# Patient Record
Sex: Female | Born: 1990
Health system: Southern US, Community
[De-identification: ages and names within clinical notes are randomized; demographics above are authoritative.]

## PROBLEM LIST (undated history)

## (undated) DIAGNOSIS — K589 Irritable bowel syndrome without diarrhea: Secondary | ICD-10-CM

## (undated) DIAGNOSIS — L732 Hidradenitis suppurativa: Secondary | ICD-10-CM

## (undated) DIAGNOSIS — Z8619 Personal history of other infectious and parasitic diseases: Secondary | ICD-10-CM

## (undated) HISTORY — DX: Irritable bowel syndrome without diarrhea: K58.9

## (undated) HISTORY — DX: Hidradenitis suppurativa: L73.2

## (undated) HISTORY — DX: Personal history of other infectious and parasitic diseases: Z86.19

---
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
## 2014-10-10 DIAGNOSIS — E569 Vitamin deficiency, unspecified: Secondary | ICD-10-CM | POA: Insufficient documentation

## 2014-10-10 DIAGNOSIS — E876 Hypokalemia: Secondary | ICD-10-CM | POA: Insufficient documentation

## 2016-11-30 DIAGNOSIS — K589 Irritable bowel syndrome without diarrhea: Secondary | ICD-10-CM

## 2016-11-30 HISTORY — DX: Irritable bowel syndrome, unspecified: K58.9

## 2018-12-14 ENCOUNTER — Ambulatory Visit: Payer: Self-pay | Admitting: Physician Assistant

## 2018-12-16 ENCOUNTER — Ambulatory Visit (INDEPENDENT_AMBULATORY_CARE_PROVIDER_SITE_OTHER): Payer: No Typology Code available for payment source | Admitting: Physician Assistant

## 2018-12-16 ENCOUNTER — Encounter: Payer: Self-pay | Admitting: Physician Assistant

## 2018-12-16 VITALS — BP 110/60 | HR 93 | Temp 98.8°F | Ht 68.0 in | Wt 211.2 lb

## 2018-12-16 DIAGNOSIS — Z13220 Encounter for screening for lipoid disorders: Secondary | ICD-10-CM | POA: Diagnosis not present

## 2018-12-16 DIAGNOSIS — Z0001 Encounter for general adult medical examination with abnormal findings: Secondary | ICD-10-CM | POA: Diagnosis not present

## 2018-12-16 DIAGNOSIS — Z136 Encounter for screening for cardiovascular disorders: Secondary | ICD-10-CM

## 2018-12-16 DIAGNOSIS — Z114 Encounter for screening for human immunodeficiency virus [HIV]: Secondary | ICD-10-CM

## 2018-12-16 DIAGNOSIS — E669 Obesity, unspecified: Secondary | ICD-10-CM

## 2018-12-16 DIAGNOSIS — R635 Abnormal weight gain: Secondary | ICD-10-CM

## 2018-12-16 NOTE — Patient Instructions (Signed)
It was great to see you!  Please go to the lab for blood work.   Our office will call you with your results unless you have chosen to receive results via MyChart.  If your blood work is normal we will follow-up each year for physicals and as scheduled for chronic medical problems.  If anything is abnormal we will treat accordingly and get you in for a follow-up.  Take care,  [facility] Maintenance, Female Adopting a healthy lifestyle and getting preventive care can go a long way to promote health and wellness. Talk with your health care provider about what schedule of regular examinations is right for you. This is a good chance for you to check in with your provider about disease prevention and staying healthy. In between checkups, there are plenty of things you can do on your own. Experts have done a lot of research about which lifestyle changes and preventive measures are most likely to keep you healthy. Ask your health care provider for more information. Weight and diet Eat a healthy diet  Be sure to include plenty of vegetables, fruits, low-fat dairy products, and lean protein.  Do not eat a lot of foods high in solid fats, added sugars, or salt.  Get regular exercise. This is one of the most important things you can do for your health. ? Most adults should exercise for at least 150 minutes each week. The exercise should increase your heart rate and make you sweat (moderate-intensity exercise). ? Most adults should also do strengthening exercises at least twice a week. This is in addition to the moderate-intensity exercise. Maintain a healthy weight  Body mass index (BMI) is a measurement that can be used to identify possible weight problems. It estimates body fat based on height and weight. Your health care provider can help determine your BMI and help you achieve or maintain a healthy weight.  For females 57 years of age and older: ? A BMI below 18.5 is considered  underweight. ? A BMI of 18.5 to 24.9 is normal. ? A BMI of 25 to 29.9 is considered overweight. ? A BMI of 30 and above is considered obese. Watch levels of cholesterol and blood lipids  You should start having your blood tested for lipids and cholesterol at 30 years of age, then have this test every 5 years.  You may need to have your cholesterol levels checked more often if: ? Your lipid or cholesterol levels are high. ? You are older than 30 years of age. ? You are at high risk for heart disease. Cancer screening Lung Cancer  Lung cancer screening is recommended for adults 64-69 years old who are at high risk for lung cancer because of a history of smoking.  A yearly low-dose CT scan of the lungs is recommended for people who: ? Currently smoke. ? Have quit within the past 15 years. ? Have at least a 30-pack-year history of smoking. A pack year is smoking an average of one pack of cigarettes a day for 1 year.  Yearly screening should continue until it has been 15 years since you quit.  Yearly screening should stop if you develop a health problem that would prevent you from having lung cancer treatment. Breast Cancer  Practice breast self-awareness. This means understanding how your breasts normally appear and feel.  It also means doing regular breast self-exams. Let your health care provider know about any changes, no matter how small.  If you are in your  your 20s or 30s, you should have a clinical breast exam (CBE) by a health care provider every 1-3 years as part of a regular health exam.  If you are 40 or older, have a CBE every year. Also consider having a breast X-ray (mammogram) every year.  If you have a family history of breast cancer, talk to your health care provider about genetic screening.  If you are at high risk for breast cancer, talk to your health care provider about having an MRI and a mammogram every year.  Breast cancer gene (BRCA) assessment is recommended  for women who have family members with BRCA-related cancers. BRCA-related cancers include: ? Breast. ? Ovarian. ? Tubal. ? Peritoneal cancers.  Results of the assessment will determine the need for genetic counseling and BRCA1 and BRCA2 testing. Cervical Cancer Your health care provider may recommend that you be screened regularly for cancer of the pelvic organs (ovaries, uterus, and vagina). This screening involves a pelvic examination, including checking for microscopic changes to the surface of your cervix (Pap test). You may be encouraged to have this screening done every 3 years, beginning at age 21.  For women ages 30-65, health care providers may recommend pelvic exams and Pap testing every 3 years, or they may recommend the Pap and pelvic exam, combined with testing for human papilloma virus (HPV), every 5 years. Some types of HPV increase your risk of cervical cancer. Testing for HPV may also be done on women of any age with unclear Pap test results.  Other health care providers may not recommend any screening for nonpregnant women who are considered low risk for pelvic cancer and who do not have symptoms. Ask your health care provider if a screening pelvic exam is right for you.  If you have had past treatment for cervical cancer or a condition that could lead to cancer, you need Pap tests and screening for cancer for at least 20 years after your treatment. If Pap tests have been discontinued, your risk factors (such as having a new sexual partner) need to be reassessed to determine if screening should resume. Some women have medical problems that increase the chance of getting cervical cancer. In these cases, your health care provider may recommend more frequent screening and Pap tests. Colorectal Cancer  This type of cancer can be detected and often prevented.  Routine colorectal cancer screening usually begins at 30 years of age and continues through 30 years of age.  Your health  care provider may recommend screening at an earlier age if you have risk factors for colon cancer.  Your health care provider may also recommend using home test kits to check for hidden blood in the stool.  A small camera at the end of a tube can be used to examine your colon directly (sigmoidoscopy or colonoscopy). This is done to check for the earliest forms of colorectal cancer.  Routine screening usually begins at age 50.  Direct examination of the colon should be repeated every 5-10 years through 30 years of age. However, you may need to be screened more often if early forms of precancerous polyps or small growths are found. Skin Cancer  Check your skin from head to toe regularly.  Tell your health care provider about any new moles or changes in moles, especially if there is a change in a mole's shape or color.  Also tell your health care provider if you have a mole that is larger than the size of a pencil   Always use sunscreen. Apply sunscreen liberally and repeatedly throughout the day.  Protect yourself by wearing long sleeves, pants, a wide-brimmed hat, and sunglasses whenever you are outside. Heart disease, diabetes, and high blood pressure  High blood pressure causes heart disease and increases the risk of stroke. High blood pressure is more likely to develop in: ? People who have blood pressure in the high end of the normal range (130-139/85-89 mm Hg). ? People who are overweight or obese. ? People who are African American.  If you are 43-31 years of age, have your blood pressure checked every 3-5 years. If you are 1 years of age or older, have your blood pressure checked every year. You should have your blood pressure measured twice-once when you are at a hospital or clinic, and once when you are not at a hospital or clinic. Record the average of the two measurements. To check your blood pressure when you are not at a hospital or clinic, you can use: ? An automated  blood pressure machine at a pharmacy. ? A home blood pressure monitor.  If you are between 15 years and 84 years old, ask your health care provider if you should take aspirin to prevent strokes.  Have regular diabetes screenings. This involves taking a blood sample to check your fasting blood sugar level. ? If you are at a normal weight and have a low risk for diabetes, have this test once every three years after 30 years of age. ? If you are overweight and have a high risk for diabetes, consider being tested at a younger age or more often. Preventing infection Hepatitis B  If you have a higher risk for hepatitis B, you should be screened for this virus. You are considered at high risk for hepatitis B if: ? You were born in a country where hepatitis B is common. Ask your health care provider which countries are considered high risk. ? Your parents were born in a high-risk country, and you have not been immunized against hepatitis B (hepatitis B vaccine). ? You have HIV or AIDS. ? You use needles to inject street drugs. ? You live with someone who has hepatitis B. ? You have had sex with someone who has hepatitis B. ? You get hemodialysis treatment. ? You take certain medicines for conditions, including cancer, organ transplantation, and autoimmune conditions. Hepatitis C  Blood testing is recommended for: ? Everyone born from [birth year] through 1967. ? Anyone with known risk factors for hepatitis C. Sexually transmitted infections (STIs)  You should be screened for sexually transmitted infections (STIs) including gonorrhea and chlamydia if: ? You are sexually active and are younger than 30 years of age. ? You are older than 30 years of age and your health care provider tells you that you are at risk for this type of infection. ? Your sexual activity has changed since you were last screened and you are at an increased risk for chlamydia or gonorrhea. Ask your health care provider if you are at  risk.  If you do not have HIV, but are at risk, it may be recommended that you take a prescription medicine daily to prevent HIV infection. This is called pre-exposure prophylaxis (PrEP). You are considered at risk if: ? You are sexually active and do not regularly use condoms or know the HIV status of your partner(s). ? You take drugs by injection. ? You are sexually active with a partner who has HIV. Talk with your health care provider  about whether you are at high risk of being infected with HIV. If you choose to begin PrEP, you should first be tested for HIV. You should then be tested every 3 months for as long as you are taking PrEP. Pregnancy  If you are premenopausal and you may become pregnant, ask your health care provider about preconception counseling.  If you may become pregnant, take 400 to 800 micrograms (mcg) of folic acid every day.  If you want to prevent pregnancy, talk to your health care provider about birth control (contraception). Osteoporosis and menopause  Osteoporosis is a disease in which the bones lose minerals and strength with aging. This can result in serious bone fractures. Your risk for osteoporosis can be identified using a bone density scan.  If you are 68 years of age or older, or if you are at risk for osteoporosis and fractures, ask your health care provider if you should be screened.  Ask your health care provider whether you should take a calcium or vitamin D supplement to lower your risk for osteoporosis.  Menopause may have certain physical symptoms and risks.  Hormone replacement therapy may reduce some of these symptoms and risks. Talk to your health care provider about whether hormone replacement therapy is right for you. Follow these instructions at home:  Schedule regular health, dental, and eye exams.  Stay current with your immunizations.  Do not use any tobacco products including cigarettes, chewing tobacco, or electronic  cigarettes.  If you are pregnant, do not drink alcohol.  If you are breastfeeding, limit how much and how often you drink alcohol.  Limit alcohol intake to no more than 1 drink per day for nonpregnant women. One drink equals 12 ounces of beer, 5 ounces of wine, or 1 ounces of hard liquor.  Do not use street drugs.  Do not share needles.  Ask your health care provider for help if you need support or information about quitting drugs.  Tell your health care provider if you often feel depressed.  Tell your health care provider if you have ever been abused or do not feel safe at home. This information is not intended to replace advice given to you by your health care provider. Make sure you discuss any questions you have with your health care provider. Document Released: 06/01/2011 Document Revised: 04/23/2016 Document Reviewed: 08/20/2015 Elsevier Interactive Patient Education  2019 [employer].

## 2018-12-16 NOTE — Progress Notes (Signed)
Gina Moss is a 28 y.o. female here to Establish Care and Annual Physical.  I acted as a Neurosurgeon for [employer], PA-C Corky Mull, LPN  History of Present Illness:   Chief Complaint  Patient presents with  . Establish Care  . Annual Exam    Acute Concerns: None  Chronic Issues: IBS -- endoscopy and colonoscopy in 2018; made dietary changes and has relieved symptoms; typically has constipation >> diarrhea Hydradenitis -- June 2019 was last flare, usually antibiotics help her symptoms, however has required two to be drained Obesity -- currently trying to get off her "grad school weight". Trying to work on better dietary choices and get into an exercise regimen.  Health Maintenance: Immunizations -- UTD Colonoscopy -- N/A Mammogram -- N/A PAP -- 2018 normal per pt will obtain records Bone Density --N/A Diet -- vegan, avoids seltzer water, avoids certain veggies that cause issues with IBS Caffeine intake -- 1 cup coffee daily Sleep habits -- good sleeper, 15 mg melatonin nightly Exercise -- does walk her dog, has exercise bike in her home Weight -- Weight: 211 lb 4 oz (95.8 kg) , highest weight 260 lb Mood -- no issues Alcohol -- none excessive Tobacco -- none  Depression screen PHQ 2/9 12/16/2018  Decreased Interest 0  Down, Depressed, Hopeless 0  PHQ - 2 Score 0    No flowsheet data found.   Other providers/specialists: Patient Care Team: Jarold Motto, [place] as PCP - General (Physician Assistant)   Past Medical History:  Diagnosis Date  . History of chicken pox   . Hydradenitis     cysts drained in 2018 and 2019  . IBS (irritable bowel syndrome) 2018     Social History   Socioeconomic History  . Marital status: Single    Spouse name: Not on file  . Number of children: Not on file  . Years of education: Not on file  . Highest education level: Not on file  Occupational History  . Not on file  Social Needs  . Financial resource strain: Not on  file  . Food insecurity:    Worry: Not on file    Inability: Not on file  . Transportation needs:    Medical: Not on file    Non-medical: Not on file  Tobacco Use  . Smoking status: Never Smoker  . Smokeless tobacco: Never Used  Substance and Sexual Activity  . Alcohol use: Yes    Frequency: Never    Comment: socially  . Drug use: Never  . Sexual activity: Yes    Comment: Vasectomy  Lifestyle  . Physical activity:    Days per week: Not on file    Minutes per session: Not on file  . Stress: Not on file  Relationships  . Social connections:    Talks on phone: Not on file    Gets together: Not on file    Attends religious service: Not on file    Active member of club or organization: Not on file    Attends meetings of clubs or organizations: Not on file    Relationship status: Not on file  . Intimate partner violence:    Fear of current or ex partner: Not on file    Emotionally abused: Not on file    Physically abused: Not on file    Forced sexual activity: Not on file  Other Topics Concern  . 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 TONSILLECTOMY AND ADENOIDECTOMY  2007  . WISDOM TOOTH EXTRACTION Bilateral 2007    Family History  Problem Relation Age of Onset  . Depression Paternal Grandmother   . Alcohol abuse Paternal Grandfather     Allergies  Allergen Reactions  . Cephalexin Itching  . Hydrocodone Anaphylaxis and Shortness Of Breath  . Sulfa Antibiotics Hives  . Naproxen Other (See Comments)    Nose bleeds  . Aspirin Other (See Comments)    Nose bleeds  . Pollen Extract      Current Medications:   Current Outpatient Medications:  Marland Kitchen.  Melatonin 5 MG CAPS, Take 15 mg by mouth at bedtime. , Disp: , Rfl:  .  polyethylene glycol powder (GLYCOLAX/MIRALAX) powder, Take by mouth., Disp: , Rfl:    Review of Systems:   Review of Systems   Constitutional: Negative for chills, fever, malaise/fatigue and weight loss.  HENT: Negative for hearing loss, sinus pain and sore throat.   Respiratory: Negative for cough and hemoptysis.   Cardiovascular: Negative for chest pain, palpitations, leg swelling and PND.  Gastrointestinal: Positive for constipation. Negative for abdominal pain, diarrhea, heartburn, nausea and vomiting.  Genitourinary: Negative for dysuria, frequency and urgency.  Musculoskeletal: Negative for back pain, myalgias and neck pain.  Skin: Negative for itching and rash.  Neurological: Negative for dizziness, tingling, seizures and headaches.  Endo/Heme/Allergies: Negative for polydipsia.  Psychiatric/Behavioral: Negative for depression. The patient is not nervous/anxious.    Vitals:   Vitals:   12/16/18 1503  BP: 110/60  Pulse: 93  Temp: 98.8 F (37.1 C)  TempSrc: Oral  SpO2: 99%  Weight: 211 lb 4 oz (95.8 kg)  Height: 5\' 8"  (1.727 m)      Body mass index is 32.12 kg/m.  Physical Exam:   Physical Exam Vitals signs and nursing note reviewed.  Constitutional:      General: She is not in acute distress.    Appearance: Normal appearance. She is well-developed. She is not ill-appearing or toxic-appearing.  HENT:     Head: Normocephalic and atraumatic.     Right Ear: Tympanic membrane, ear canal and external ear normal. Tympanic membrane is not erythematous, retracted or bulging.     Left Ear: Tympanic membrane, ear canal and external ear normal. Tympanic membrane is not erythematous, retracted or bulging.  Eyes:     General: Lids are normal.     Conjunctiva/sclera: Conjunctivae normal.     Pupils: Pupils are equal, round, and reactive to light.  Neck:     Musculoskeletal: Full passive range of motion without pain.     Trachea: Trachea normal.  Cardiovascular:     Rate and Rhythm: Normal rate and regular rhythm.     Heart sounds: Normal heart sounds, S1 normal and S2 normal.  Pulmonary:      Effort: Pulmonary effort is normal. No tachypnea or respiratory distress.     Breath sounds: Normal breath sounds. No decreased breath sounds, wheezing, rhonchi or rales.  Abdominal:     General: Bowel sounds are normal.     Palpations: Abdomen is soft.     Tenderness: There is no abdominal tenderness.  Musculoskeletal: Normal range of motion.  Lymphadenopathy:     Cervical: No cervical adenopathy.  Skin:    General: Skin is warm and dry.  Neurological:     Mental Status: She is alert.     GCS: GCS eye  subscore is 4. GCS verbal subscore is 5. GCS motor subscore is 6.     Cranial Nerves: No cranial nerve deficit.     Sensory: No sensory deficit.     Deep Tendon Reflexes: Reflexes are normal and symmetric.  Psychiatric:        Speech: Speech normal.        Behavior: Behavior normal. Behavior is cooperative.     No results found for this or any previous visit.  Assessment and Plan:   Gina Moss was seen today for establish care and annual exam.  Diagnoses and all orders for this visit:  Encounter for general adult medical examination with abnormal findings Today patient counseled on age appropriate routine health concerns for screening and prevention, each reviewed and up to date or declined. Immunizations reviewed and up to date or declined. Labs ordered and reviewed. Risk factors for depression reviewed and negative. Hearing function and visual acuity are intact. ADLs screened and addressed as needed. Functional ability and level of safety reviewed and appropriate. Education, counseling and referrals performed based on assessed risks today. Patient provided with a copy of personalized plan for preventive services.  Screening for HIV (human immunodeficiency virus) -     HIV Antibody (routine testing w rflx)  Encounter for lipid screening for cardiovascular disease -     Lipid panel  Obesity, unspecified classification, unspecified obesity type, unspecified whether serious  comorbidity present She is open to trying Metformin potentially to help with weight loss. We discussed this today in the office. Will check HgbA1c and assess if this is appropriate option for her. -     CBC with Differential/Platelet -     Comprehensive metabolic panel -     Hemoglobin A1c  . Reviewed expectations re: course of current medical issues. . Discussed self-management of symptoms. . Outlined signs and symptoms indicating need for more acute intervention. . Patient verbalized understanding and all questions were answered. . See orders for this visit as documented in the electronic medical record. . Patient received an After-Visit Summary.  CMA or LPN served as scribe during this visit. History, Physical, and Plan performed by medical provider. The above documentation has been reviewed and is accurate and complete.   Jarold MottoSamantha Nastassia Bazaldua, PA-C

## 2018-12-17 LAB — LIPID PANEL
Cholesterol: 172 mg/dL (ref <200)
HDL: 51 mg/dL (ref >50)
LDL Cholesterol (Calc): 98 mg/dL (calc)
Non-HDL Cholesterol (Calc): 121 mg/dL (calc) (ref <130)
Total CHOL/HDL Ratio: 3.4 (calc) (ref <5.0)
Triglycerides: 124 mg/dL (ref <150)

## 2018-12-17 LAB — CBC WITH DIFFERENTIAL/PLATELET
Absolute Monocytes: 592 cells/uL (ref 200–950)
BASOS ABS: 70 {cells}/uL (ref 0–200)
Basophils Relative: 0.8 %
Eosinophils Absolute: 261 cells/uL (ref 15–500)
Eosinophils Relative: 3 %
HCT: 40 % (ref 35.0–45.0)
Hemoglobin: 14 g/dL (ref 11.7–15.5)
Lymphs Abs: 2610 cells/uL (ref 850–3900)
MCH: 32.1 pg (ref 27.0–33.0)
MCHC: 35 g/dL (ref 32.0–36.0)
MCV: 91.7 fL (ref 80.0–100.0)
MPV: 10.3 fL (ref 7.5–12.5)
Monocytes Relative: 6.8 %
Neutro Abs: 5168 cells/uL (ref 1500–7800)
Neutrophils Relative %: 59.4 %
Platelets: 374 10*3/uL (ref 140–400)
RBC: 4.36 10*6/uL (ref 3.80–5.10)
RDW: 12.2 % (ref 11.0–15.0)
TOTAL LYMPHOCYTE: 30 %
WBC: 8.7 10*3/uL (ref 3.8–10.8)

## 2018-12-17 LAB — COMPREHENSIVE METABOLIC PANEL
AG Ratio: 1.8 (calc) (ref 1.0–2.5)
ALT: 9 U/L (ref 6–29)
AST: 15 U/L (ref 10–30)
Albumin: 4.6 g/dL (ref 3.6–5.1)
Alkaline phosphatase (APISO): 86 U/L (ref 33–115)
BILIRUBIN TOTAL: 0.9 mg/dL (ref 0.2–1.2)
BUN: 12 mg/dL (ref 7–25)
CALCIUM: 9.9 mg/dL (ref 8.6–10.2)
CO2: 28 mmol/L (ref 20–32)
Chloride: 103 mmol/L (ref 98–110)
Creat: 0.81 mg/dL (ref 0.50–1.10)
Globulin: 2.5 g/dL (calc) (ref 1.9–3.7)
Glucose, Bld: 78 mg/dL (ref 65–99)
Potassium: 4.3 mmol/L (ref 3.5–5.3)
Sodium: 139 mmol/L (ref 135–146)
Total Protein: 7.1 g/dL (ref 6.1–8.1)

## 2018-12-17 LAB — HEMOGLOBIN A1C
Hgb A1c MFr Bld: 5.1 % of total Hgb (ref <5.7)
Mean Plasma Glucose: 100 (calc)
eAG (mmol/L): 5.5 (calc)

## 2018-12-17 LAB — HIV ANTIBODY (ROUTINE TESTING W REFLEX): HIV 1&2 Ab, 4th Generation: NONREACTIVE

## 2019-03-29 ENCOUNTER — Ambulatory Visit: Payer: Self-pay | Admitting: Family Medicine

## 2019-06-12 ENCOUNTER — Ambulatory Visit (INDEPENDENT_AMBULATORY_CARE_PROVIDER_SITE_OTHER): Payer: No Typology Code available for payment source | Admitting: Physician Assistant

## 2019-06-12 ENCOUNTER — Encounter: Payer: Self-pay | Admitting: Physician Assistant

## 2019-06-12 DIAGNOSIS — M25562 Pain in left knee: Secondary | ICD-10-CM | POA: Diagnosis not present

## 2019-06-12 DIAGNOSIS — G8929 Other chronic pain: Secondary | ICD-10-CM | POA: Diagnosis not present

## 2019-06-12 NOTE — Progress Notes (Signed)
Virtual Visit via Video   I connected with Gina Moss on 06/12/19 at  1:20 PM EDT by a video enabled telemedicine application and verified that I am speaking with the correct person using two identifiers. Location patient: Home Location provider: [facility], Office Persons participating in the virtual visit: Marqueta, Pulley PA-C   I discussed the limitations of evaluation and management by telemedicine and the availability of in person appointments. The patient expressed understanding and agreed to proceed.   Subjective:   HPI:    Late January, fell and slipped on L knee.   Constant pain, achy. Pain is at back of knee. Does feel like she has had weakness in this area because she is now favoring her R knee. Has tried a knee compression, with sleeve she purchased OTC. Used heating prn. When she is active she feels like this knee gets more "red."   Denies: calf pain, sensation that knee is giving out, numbness/tingling  ROS: See pertinent positives and negatives per HPI.  There are no active problems to display for this patient.   Social History   Tobacco Use  . Smoking status: Never Smoker  . Smokeless tobacco: Never Used  Substance Use Topics  . Alcohol use: Yes    Frequency: Never    Comment: socially    Current Outpatient Medications:  Marland Kitchen  Melatonin 5 MG CAPS, Take 15 mg by mouth at bedtime. , Disp: , Rfl:  .  polyethylene glycol powder (GLYCOLAX/MIRALAX) powder, Take by mouth., Disp: , Rfl:   Allergies  Allergen Reactions  . Cephalexin Itching  . Hydrocodone Anaphylaxis and Shortness Of Breath  . Sulfa Antibiotics Hives  . Naproxen Other (See Comments)    Nose bleeds  . Aspirin Other (See Comments)    Nose bleeds  . Pollen Extract     Objective:   VITALS: Per patient if applicable, see vitals. GENERAL: Alert, appears well and in no acute distress. HEENT: Atraumatic, conjunctiva clear, no obvious abnormalities on inspection of external  nose and ears. NECK: Normal movements of the head and neck. CARDIOPULMONARY: No increased WOB. Speaking in clear sentences. I:E ratio WNL.  MS: Moves all visible extremities without noticeable abnormality. PSYCH: Pleasant and cooperative, well-groomed. Speech normal rate and rhythm. Affect is appropriate. Insight and judgement are appropriate. Attention is focused, linear, and appropriate.  NEURO: CN grossly intact. Oriented as arrived to appointment on time with no prompting. Moves both UE equally.  SKIN: No obvious lesions, wounds, erythema, or cyanosis noted on face or hands.  Assessment and Plan:   Diagnoses and all orders for this visit:  Chronic pain of left knee   Pain x 3-4 months. Unfortunately, this is difficult to assess virtually. Discussed our options, and after shared decision making will send to PT for further evaluation and treatment. Worsening precautions advised. Low threshold to send to sports medicine for further work-up.  . Reviewed expectations re: course of current medical issues. . Discussed self-management of symptoms. . Outlined signs and symptoms indicating need for more acute intervention. . Patient verbalized understanding and all questions were answered. Marland Kitchen Health Maintenance issues including appropriate healthy diet, exercise, and smoking avoidance were discussed with patient. . See orders for this visit as documented in the electronic medical record.  I discussed the assessment and treatment plan with the patient. The patient was provided an opportunity to ask questions and all were answered. The patient agreed with the plan and demonstrated an understanding of the instructions.  The patient was advised to call back or seek an in-person evaluation if the symptoms worsen or if the condition fails to improve as anticipated.    Walnut GroveSamantha Jalayne Ganesh, [place] 06/12/2019

## 2019-06-20 ENCOUNTER — Ambulatory Visit: Payer: No Typology Code available for payment source | Admitting: Physical Therapy

## 2019-06-20 ENCOUNTER — Other Ambulatory Visit: Payer: Self-pay

## 2019-06-20 ENCOUNTER — Encounter: Payer: Self-pay | Admitting: Physical Therapy

## 2019-06-20 DIAGNOSIS — G8929 Other chronic pain: Secondary | ICD-10-CM

## 2019-06-20 DIAGNOSIS — M25562 Pain in left knee: Secondary | ICD-10-CM | POA: Diagnosis not present

## 2019-06-20 DIAGNOSIS — R2689 Other abnormalities of gait and mobility: Secondary | ICD-10-CM | POA: Diagnosis not present

## 2019-06-20 NOTE — Patient Instructions (Signed)
Access Code: [email or web address]  URL: [email or web address]  Date: 06/20/2019  Prepared by: Lyndee Hensen   Exercises Long Sitting Quad Set - 10 reps - 2 sets - 5 hold - 2x daily Straight Leg Raise - 10 reps - 2 sets - 1x daily Sidelying Hip Abduction - 10 reps - 2 sets - 1x daily Seated Knee Extension AROM - 10 reps - 2 sets - 2x daily Seated Hamstring Stretch - 3 reps - 30 hold - 2x daily

## 2019-06-20 NOTE — Therapy (Signed)
[facility] [address] [place], [place], [address] Phone: [phone]   Fax:  [phone]  Physical Therapy Evaluation  Patient Details  Name: Gina CutterCharlotte Nicholl MRN: [id number] Date of Birth: 01-24-1991 Referring Provider (PT): Jarold MottoSamantha Worley, [place]   Encounter Date: 06/20/2019  PT End of Session - 06/20/19 1407    Visit Number  1    Number of Visits  12    Date for PT Re-Evaluation  08/01/19    Authorization Type  [facility] plan    PT Start Time  1248    PT Stop Time  1335    PT Time Calculation (min)  47 min    Activity Tolerance  Patient tolerated treatment well    Behavior During Therapy  [facility] for tasks assessed/performed       Past Medical History:  Diagnosis Date  . History of chicken pox   . Hydradenitis     cysts drained in 2018 and 2019  . IBS (irritable bowel syndrome) 2018    Past Surgical History:  Procedure Laterality Date  . TONSILLECTOMY AND ADENOIDECTOMY  2007  . WISDOM TOOTH EXTRACTION Bilateral 2007    There were no vitals filed for this visit.   Subjective Assessment - 06/20/19 1251    Subjective  Pt had fall in January on ice, fell onto L knee. States immediate pain and swelling that has decreased, but pain has continued. Pt has had to stop walking her dog, due to pain. Most pain with prolonged walking, stairs, squatting. She works full time at [employer], desk and walking in hospital, wears flats. She would like to resume walking, but does minimal exercise other than that.    Limitations  Walking;House hold activities    Patient Stated Goals  Decreased pain, return to walking dog, return to bowling.    Currently in Pain?  Yes    Pain Score  5     Pain Location  Knee    Pain Orientation  Left    Pain Descriptors / Indicators  Aching    Pain Type  Acute pain    Pain Onset  More than a month ago    Pain Frequency  Intermittent    Aggravating Factors   walking, stairs, squat    Pain Relieving  Factors  rest         [facility] PT Assessment - 06/20/19 0001      Assessment   Medical Diagnosis  L knee pain    Referring Provider (PT)  Jarold MottoSamantha Worley, PA    Prior Therapy  no      Balance Screen   Has the patient fallen in the past 6 months  Yes    How many times?  1    Has the patient had a decrease in activity level because of a fear of falling?   Yes    Is the patient reluctant to leave their home because of a fear of falling?   No      Prior Function   Level of Independence  Independent      Cognition   Overall Cognitive Status  Within Functional Limits for tasks assessed      Posture/Postural Control   Posture Comments  Bil knee hyperextension      ROM / Strength   AROM / PROM / Strength  AROM;Strength      AROM   Overall AROM Comments  R Knee/Hip: WNL, knee hypermobile;  L knee: PROM: WNL, AROM: mild  limitation for flex/ext with pain;  L Hip: WNL.       Strength   Overall Strength Comments  L knee flex: 4/5,  ext: 4-/5;  Hip: 4-/5       Palpation   Palpation comment  Pain at anterior/medial knee, medial joint line, no pain in MCL or patella tendon.  No pain at LCL, but mild laxity with varus stress.  Also pain in posterior knee at lateral hamstring insertion.      Special Tests   Other special tests  Laxity with varus stress;  Increased pain with McMurrays.       Ambulation/Gait   Gait Comments  Slightly antalgic                Objective measurements completed on examination: See above findings.      [facility] Adult PT Treatment/Exercise - 06/20/19 0001      Exercises   Exercises  Knee/Hip      Knee/Hip Exercises: Stretches   Active Hamstring Stretch  3 reps;30 seconds    Active Hamstring Stretch Limitations  seated      Knee/Hip Exercises: Seated   Long Arc Quad  10 reps;Left      Knee/Hip Exercises: Supine   Quad Sets  10 reps;Both    Straight Leg Raises  10 reps;Both      Knee/Hip Exercises: Sidelying   Hip ABduction  10 reps;Both       Modalities   Modalities  Iontophoresis      Iontophoresis   Type of Iontophoresis  Dexamethasone    Location  L ant/medial knee    Time  6 hr patch             PT Education - 06/20/19 1407    Education Details  HEP, PT POC, exam findings    Person(s) Educated  Patient    Methods  Explanation;Demonstration;Tactile cues;Verbal cues;Handout    Comprehension  Verbalized understanding;Returned demonstration;Verbal cues required;Need further instruction;Tactile cues required       PT Short Term Goals - 06/20/19 1420      PT SHORT TERM GOAL #1   Title  Pt to be independent with initial HEP    Time  2    Period  Weeks    Status  New    Target Date  07/04/19        PT Long Term Goals - 06/20/19 1420      PT LONG TERM GOAL #1   Title  Pt to be independent with final HEP for hip and knee strength    Time  6    Period  Weeks    Status  New    Target Date  08/01/19      PT LONG TERM GOAL #2   Title  Pt to report decreased pain in L knee, to 0-2/10 with standing, walking and stairs.    Time  6    Period  Weeks    Status  New    Target Date  07/04/19      PT LONG TERM GOAL #3   Title  Pt to report ability for walking up to 1 mile with pain 2/10 or less.    Time  6    Period  Weeks    Status  New    Target Date  08/01/19      PT LONG TERM GOAL #4   Title  Pt to demo improved strength of L hip and knee, to at least 4+/5  to improve stability and pain.    Time  6    Period  Weeks    Status  New    Target Date  08/01/19             Plan - 06/20/19 1408    Clinical Impression Statement  Pt presents with primary complaint of increased pain in L knee from fall in January. She has increased pain at medial patella border, as well as in posterior lateral knee. She has good ROM ability, but has pain with end range flexion and extension (pt with baseline hyperextension bilaterally). She has decreased strength of quad and hip on L vs R, and decreased stability. Pt  with decreased ability and endurance for standing, walking, stairs, and squatting. Pt to benefit from skilled PT to improve deficits and pain.    Personal Factors and Comorbidities  Time since onset of injury/illness/exacerbation    Examination-Activity Limitations  Locomotion Level;Bend;Carry;Squat;Stairs;Stand    Examination-Participation Restrictions  Cleaning;Community Activity    Stability/Clinical Decision Making  Stable/Uncomplicated    Clinical Decision Making  Low    Rehab Potential  Good    PT Frequency  2x / week    PT Duration  6 weeks    PT Treatment/Interventions  ADLs/Self Care Home Management;Cryotherapy;Electrical Stimulation;DME Instruction;Ultrasound;Traction;Moist Heat;Iontophoresis 4mg /ml Dexamethasone;Gait training;Stair training;Functional mobility training;Therapeutic activities;Therapeutic exercise;Balance training;Orthotic Fit/Training;Patient/family education;Neuromuscular re-education;Manual techniques;Passive range of motion;Dry needling;Spinal Manipulations;Taping;Joint Manipulations;Energy conservation    Consulted and Agree with Plan of Care  Patient       Patient will benefit from skilled therapeutic intervention in order to improve the following deficits and impairments:  Abnormal gait, Decreased range of motion, Increased muscle spasms, Decreased endurance, Decreased activity tolerance, Pain, Decreased balance, Impaired flexibility, Improper body mechanics, Decreased strength, Decreased mobility  Visit Diagnosis: 1. Chronic pain of left knee   2. Other abnormalities of gait and mobility        Problem List There are no active problems to display for this patient.   Sedalia MutaLauren Elvera Almario, PT, DPT 2:32 PM  06/20/19    [facility] [address] [place], [place], [address] Phone: [phone]   Fax:  [phone]  Name: Gina CutterCharlotte Cajas MRN: [id number] Date of Birth: [birth year]

## 2019-06-22 ENCOUNTER — Ambulatory Visit (INDEPENDENT_AMBULATORY_CARE_PROVIDER_SITE_OTHER): Payer: No Typology Code available for payment source | Admitting: Physical Therapy

## 2019-06-22 ENCOUNTER — Encounter: Payer: Self-pay | Admitting: Physical Therapy

## 2019-06-22 ENCOUNTER — Other Ambulatory Visit: Payer: Self-pay

## 2019-06-22 DIAGNOSIS — G8929 Other chronic pain: Secondary | ICD-10-CM

## 2019-06-22 DIAGNOSIS — R2689 Other abnormalities of gait and mobility: Secondary | ICD-10-CM | POA: Diagnosis not present

## 2019-06-22 DIAGNOSIS — M25562 Pain in left knee: Secondary | ICD-10-CM

## 2019-06-22 NOTE — Therapy (Signed)
[facility] [address] [place], [place], [address] Phone: [phone]   Fax:  [phone]  Physical Therapy Treatment  Patient Details  Name: Gina Moss MRN: [id number] Date of Birth: 06-15-1991 Referring Provider (PT): Inda Coke, [place]   Encounter Date: 06/22/2019  PT End of Session - 06/22/19 1303    Visit Number  2    Number of Visits  12    Date for PT Re-Evaluation  08/01/19    Authorization Type  [facility] plan    PT Start Time  1255    PT Stop Time  1340    PT Time Calculation (min)  45 min    Activity Tolerance  Patient tolerated treatment well    Behavior During Therapy  [facility] for tasks assessed/performed       Past Medical History:  Diagnosis Date  . History of chicken pox   . Hydradenitis     cysts drained in 2018 and 2019  . IBS (irritable bowel syndrome) 2018    Past Surgical History:  Procedure Laterality Date  . TONSILLECTOMY AND ADENOIDECTOMY  2007  . WISDOM TOOTH EXTRACTION Bilateral 2007    There were no vitals filed for this visit.  Subjective Assessment - 06/22/19 1302    Subjective  Pt states ionto was helpful. Mild pain in knee today.    Currently in Pain?  Yes    Pain Score  3     Pain Location  Knee    Pain Orientation  Left    Pain Descriptors / Indicators  Aching    Pain Type  Acute pain    Pain Onset  More than a month ago    Pain Frequency  Intermittent                       [facility] Adult PT Treatment/Exercise - 06/22/19 1306      Ambulation/Gait   Gait Comments  --      Posture/Postural Control   Posture Comments  --      Exercises   Exercises  Knee/Hip      Knee/Hip Exercises: Stretches   Active Hamstring Stretch  3 reps;30 seconds    Active Hamstring Stretch Limitations  seated      Knee/Hip Exercises: Aerobic   Stationary Bike  L1 x6 min      Knee/Hip Exercises: Standing   Hip Flexion  20 reps    Hip Abduction  2 sets;10 reps;Both    Other  Standing Knee Exercises  Walk/March 10 ft x4;       Knee/Hip Exercises: Seated   Long Arc Quad  10 reps;2 sets;Both    Long Arc Quad Limitations  1.5lb    Hamstring Curl  15 reps;Left    Hamstring Limitations  RTB      Knee/Hip Exercises: Supine   Quad Sets  10 reps;Left    Bridges  10 reps;2 sets    Straight Leg Raises  10 reps;2 sets;Both      Knee/Hip Exercises: Sidelying   Hip ABduction  10 reps;Both      Modalities   Modalities  Iontophoresis      Iontophoresis   Type of Iontophoresis  Dexamethasone    Location  L ant/medial knee    Time  6 hr patch      Manual Therapy   Manual Therapy  Soft tissue mobilization    Soft tissue mobilization  STM and roller to hamstring , distal hamstring  and proximal gastroc;                PT Short Term Goals - 06/20/19 1420      PT SHORT TERM GOAL #1   Title  Pt to be independent with initial HEP    Time  2    Period  Weeks    Status  New    Target Date  07/04/19        PT Long Term Goals - 06/20/19 1420      PT LONG TERM GOAL #1   Title  Pt to be independent with final HEP for hip and knee strength    Time  6    Period  Weeks    Status  New    Target Date  08/01/19      PT LONG TERM GOAL #2   Title  Pt to report decreased pain in L knee, to 0-2/10 with standing, walking and stairs.    Time  6    Period  Weeks    Status  New    Target Date  07/04/19      PT LONG TERM GOAL #3   Title  Pt to report ability for walking up to 1 mile with pain 2/10 or less.    Time  6    Period  Weeks    Status  New    Target Date  08/01/19      PT LONG TERM GOAL #4   Title  Pt to demo improved strength of L hip and knee, to at least 4+/5 to improve stability and pain.    Time  6    Period  Weeks    Status  New    Target Date  08/01/19            Plan - 06/22/19 1410    Clinical Impression Statement  Pt with good tolerance for treatment and ther ex today. Continues to have pain at anterior/medial knee, Ionto  continued, but also with quite a bit of soreness in posterior knee today. Increased tenderness with STM at entire distal hamstring region .    Personal Factors and Comorbidities  Time since onset of injury/illness/exacerbation    Examination-Activity Limitations  Locomotion Level;Bend;Carry;Squat;Stairs;Stand    Examination-Participation Restrictions  Cleaning;Community Activity    Stability/Clinical Decision Making  Stable/Uncomplicated    Rehab Potential  Good    PT Frequency  2x / week    PT Duration  6 weeks    PT Treatment/Interventions  ADLs/Self Care Home Management;Cryotherapy;Electrical Stimulation;DME Instruction;Ultrasound;Traction;Moist Heat;Iontophoresis 4mg /ml Dexamethasone;Gait training;Stair training;Functional mobility training;Therapeutic activities;Therapeutic exercise;Balance training;Orthotic Fit/Training;Patient/family education;Neuromuscular re-education;Manual techniques;Passive range of motion;Dry needling;Spinal Manipulations;Taping;Joint Manipulations;Energy conservation    Consulted and Agree with Plan of Care  Patient       Patient will benefit from skilled therapeutic intervention in order to improve the following deficits and impairments:  Abnormal gait, Decreased range of motion, Increased muscle spasms, Decreased endurance, Decreased activity tolerance, Pain, Decreased balance, Impaired flexibility, Improper body mechanics, Decreased strength, Decreased mobility  Visit Diagnosis: 1. Chronic pain of left knee   2. Other abnormalities of gait and mobility        Problem List There are no active problems to display for this patient.   Sedalia MutaLauren Zackaria Burkey, PT, DPT 2:15 PM  06/22/19    [facility] [address] [place], [place], [address] Phone: [phone]   Fax:  [phone]  Name: Gina CutterCharlotte Moss MRN: [id number] Date of Birth:  09/29/1991   

## 2019-06-27 ENCOUNTER — Encounter: Payer: Self-pay | Admitting: Physical Therapy

## 2019-06-27 ENCOUNTER — Other Ambulatory Visit: Payer: Self-pay

## 2019-06-27 ENCOUNTER — Ambulatory Visit: Payer: No Typology Code available for payment source | Admitting: Physical Therapy

## 2019-06-27 DIAGNOSIS — M25562 Pain in left knee: Secondary | ICD-10-CM

## 2019-06-27 DIAGNOSIS — G8929 Other chronic pain: Secondary | ICD-10-CM

## 2019-06-27 DIAGNOSIS — R2689 Other abnormalities of gait and mobility: Secondary | ICD-10-CM | POA: Diagnosis not present

## 2019-06-27 NOTE — Therapy (Signed)
[facility] [address] [place], [place], [address] Phone: [phone]   Fax:  [phone]  Physical Therapy Treatment  Patient Details  Name: Gina Moss MRN: [id number] Date of Birth: 1991/09/06 Referring Provider (PT): Inda Coke, [place]   Encounter Date: 06/27/2019  PT End of Session - 06/27/19 1532    Visit Number  3    Number of Visits  12    Date for PT Re-Evaluation  08/01/19    Authorization Type  [facility] plan    PT Start Time  1302    PT Stop Time  1345    PT Time Calculation (min)  43 min    Activity Tolerance  Patient tolerated treatment well    Behavior During Therapy  [facility] for tasks assessed/performed       Past Medical History:  Diagnosis Date  . History of chicken pox   . Hydradenitis     cysts drained in 2018 and 2019  . IBS (irritable bowel syndrome) 2018    Past Surgical History:  Procedure Laterality Date  . TONSILLECTOMY AND ADENOIDECTOMY  2007  . WISDOM TOOTH EXTRACTION Bilateral 2007    There were no vitals filed for this visit.  Subjective Assessment - 06/27/19 1531    Subjective  Pt states anterior knee feeling a bit better, and pain overall is better, but back of knee is still pretty sore.    Currently in Pain?  Yes    Pain Score  3     Pain Location  Knee    Pain Orientation  Left    Pain Descriptors / Indicators  Aching    Pain Type  Acute pain    Pain Onset  More than a month ago    Pain Frequency  Intermittent                       [facility] Adult PT Treatment/Exercise - 06/27/19 1308      Exercises   Exercises  Knee/Hip      Knee/Hip Exercises: Stretches   Active Hamstring Stretch  --    Active Hamstring Stretch Limitations  --      Knee/Hip Exercises: Aerobic   Stationary Bike  L1 x 8 min      Knee/Hip Exercises: Standing   Hip Flexion  --    Hip Abduction  2 sets;10 reps;Both    Other Standing Knee Exercises  --      Knee/Hip Exercises: Seated   Long  Arc Quad  10 reps;2 sets;Both    Long Arc Quad Limitations  2 lb    Hamstring Curl  Left;20 reps    Hamstring Limitations  RTB      Knee/Hip Exercises: Supine   Quad Sets  --    Bridges  10 reps;2 sets    Straight Leg Raises  10 reps;2 sets;Both      Knee/Hip Exercises: Sidelying   Hip ABduction  10 reps;2 sets;Both      Modalities   Modalities  Iontophoresis      Iontophoresis   Type of Iontophoresis  Dexamethasone    Location  L ant/medial knee    Time  6 hr patch      Manual Therapy   Manual Therapy  Soft tissue mobilization;Taping    Soft tissue mobilization  DTM and IASTM to L distal hamstring    Kinesiotex  Inhibit Muscle      Kinesiotix   Inhibit Muscle   2  i strips at L distal hamstring              PT Education - 06/27/19 1532    Education Details  Education on K- tape use, precautions.    Person(s) Educated  Patient    Methods  Explanation    Comprehension  Verbalized understanding       PT Short Term Goals - 06/20/19 1420      PT SHORT TERM GOAL #1   Title  Pt to be independent with initial HEP    Time  2    Period  Weeks    Status  New    Target Date  07/04/19        PT Long Term Goals - 06/20/19 1420      PT LONG TERM GOAL #1   Title  Pt to be independent with final HEP for hip and knee strength    Time  6    Period  Weeks    Status  New    Target Date  08/01/19      PT LONG TERM GOAL #2   Title  Pt to report decreased pain in L knee, to 0-2/10 with standing, walking and stairs.    Time  6    Period  Weeks    Status  New    Target Date  07/04/19      PT LONG TERM GOAL #3   Title  Pt to report ability for walking up to 1 mile with pain 2/10 or less.    Time  6    Period  Weeks    Status  New    Target Date  08/01/19      PT LONG TERM GOAL #4   Title  Pt to demo improved strength of L hip and knee, to at least 4+/5 to improve stability and pain.    Time  6    Period  Weeks    Status  New    Target Date  08/01/19             Plan - 06/27/19 1533    Clinical Impression Statement  Pt with improving pain at anterior knee. Still some soreness at medial patella, ionto continued today. Pt also with soreness with full TKE against gravity. Lateral hamstring/distal sore and tight with DTM today, pt to benefit from dry needling for this when she returns. Taping done today for inhibition. Pt has been able to progress ther ex without pain, plan to progress as able.    Personal Factors and Comorbidities  Time since onset of injury/illness/exacerbation    Examination-Activity Limitations  Locomotion Level;Bend;Carry;Squat;Stairs;Stand    Examination-Participation Restrictions  Cleaning;Community Activity    Stability/Clinical Decision Making  Stable/Uncomplicated    Rehab Potential  Good    PT Frequency  2x / week    PT Duration  6 weeks    PT Treatment/Interventions  ADLs/Self Care Home Management;Cryotherapy;Electrical Stimulation;DME Instruction;Ultrasound;Traction;Moist Heat;Iontophoresis 4mg /ml Dexamethasone;Gait training;Stair training;Functional mobility training;Therapeutic activities;Therapeutic exercise;Balance training;Orthotic Fit/Training;Patient/family education;Neuromuscular re-education;Manual techniques;Passive range of motion;Dry needling;Spinal Manipulations;Taping;Joint Manipulations;Energy conservation    Consulted and Agree with Plan of Care  Patient       Patient will benefit from skilled therapeutic intervention in order to improve the following deficits and impairments:  Abnormal gait, Decreased range of motion, Increased muscle spasms, Decreased endurance, Decreased activity tolerance, Pain, Decreased balance, Impaired flexibility, Improper body mechanics, Decreased strength, Decreased mobility  Visit Diagnosis: 1. Chronic pain of left knee   2.  Other abnormalities of gait and mobility        Problem List There are no active problems to display for this patient.  Sedalia MutaLauren Leomar Westberg, PT,  DPT 3:35 PM  06/27/19    [facility] [address] , [place], [address] Phone: [phone]   Fax:  [phone]  Name: Gina CutterCharlotte Moss MRN: [id number] Date of Birth: 12-23-1990

## 2019-06-29 ENCOUNTER — Encounter: Payer: Self-pay | Admitting: Physical Therapy

## 2019-06-29 ENCOUNTER — Ambulatory Visit: Payer: No Typology Code available for payment source | Admitting: Physical Therapy

## 2019-06-29 ENCOUNTER — Other Ambulatory Visit: Payer: Self-pay

## 2019-06-29 DIAGNOSIS — G8929 Other chronic pain: Secondary | ICD-10-CM

## 2019-06-29 DIAGNOSIS — R2689 Other abnormalities of gait and mobility: Secondary | ICD-10-CM

## 2019-06-29 DIAGNOSIS — M25562 Pain in left knee: Secondary | ICD-10-CM | POA: Diagnosis not present

## 2019-06-29 NOTE — Therapy (Signed)
[facility] [address] [place], [place], [address] Phone: [phone]   Fax:  [phone]  Physical Therapy Treatment  Patient Details  Name: Gina CutterCharlotte Moss MRN: [id number] Date of Birth: 1991/07/19 Referring Provider (PT): Jarold MottoSamantha Worley, [place]   Encounter Date: 06/29/2019  PT End of Session - 06/29/19 1512    Visit Number  4    Number of Visits  12    Date for PT Re-Evaluation  08/01/19    Authorization Type  [facility] plan    PT Start Time  1509    PT Stop Time  1551    PT Time Calculation (min)  42 min    Activity Tolerance  Patient tolerated treatment well    Behavior During Therapy  [facility] for tasks assessed/performed       Past Medical History:  Diagnosis Date  . History of chicken pox   . Hydradenitis     cysts drained in 2018 and 2019  . IBS (irritable bowel syndrome) 2018    Past Surgical History:  Procedure Laterality Date  . TONSILLECTOMY AND ADENOIDECTOMY  2007  . WISDOM TOOTH EXTRACTION Bilateral 2007    There were no vitals filed for this visit.  Subjective Assessment - 06/29/19 1511    Subjective  Pt states front of knee is doing much better, back of knee still sore. Tape was ok on pts skin, wore for about 1 day.    Currently in Pain?  Yes    Pain Score  3     Pain Location  Knee    Pain Orientation  Left    Pain Descriptors / Indicators  Aching    Pain Type  Acute pain    Pain Onset  More than a month ago    Pain Frequency  Intermittent                       [facility] Adult PT Treatment/Exercise - 06/29/19 1515      Exercises   Exercises  Knee/Hip      Knee/Hip Exercises: Stretches   Active Hamstring Stretch  3 reps;30 seconds    Active Hamstring Stretch Limitations  supine w strap and ankle pump      Knee/Hip Exercises: Aerobic   Stationary Bike  L1 x 8 min      Knee/Hip Exercises: Standing   Hip Abduction  2 sets;10 reps;Both    Abduction Limitations  YTB    Functional Squat   --      Knee/Hip Exercises: Seated   Long Arc Quad  10 reps;2 sets;Both    Long Arc Quad Limitations  2 lb    Hamstring Curl  Left;20 reps    Hamstring Limitations  RTB      Knee/Hip Exercises: Supine   Bridges  10 reps;2 sets    Straight Leg Raises  10 reps;2 sets;Both      Knee/Hip Exercises: Sidelying   Hip ABduction  10 reps;2 sets;Both      Modalities   Modalities  Iontophoresis      Iontophoresis   Type of Iontophoresis  Dexamethasone    Location  L posterior knee/distal-lateral hasmtring    Time  6 hr patch      Manual Therapy   Manual Therapy  Soft tissue mobilization;Taping    Soft tissue mobilization  DTM and IASTM to L distal hamstring    Kinesiotex  --      Kinesiotix   Inhibit Muscle   --  PT Short Term Goals - 06/20/19 1420      PT SHORT TERM GOAL #1   Title  Pt to be independent with initial HEP    Time  2    Period  Weeks    Status  New    Target Date  07/04/19        PT Long Term Goals - 06/20/19 1420      PT LONG TERM GOAL #1   Title  Pt to be independent with final HEP for hip and knee strength    Time  6    Period  Weeks    Status  New    Target Date  08/01/19      PT LONG TERM GOAL #2   Title  Pt to report decreased pain in L knee, to 0-2/10 with standing, walking and stairs.    Time  6    Period  Weeks    Status  New    Target Date  07/04/19      PT LONG TERM GOAL #3   Title  Pt to report ability for walking up to 1 mile with pain 2/10 or less.    Time  6    Period  Weeks    Status  New    Target Date  08/01/19      PT LONG TERM GOAL #4   Title  Pt to demo improved strength of L hip and knee, to at least 4+/5 to improve stability and pain.    Time  6    Period  Weeks    Status  New    Target Date  08/01/19            Plan - 06/29/19 1559    Clinical Impression Statement  Pt with improving pain at anterior knee, improving ability for TKE in supine with less pain. She does have signficiant  sorneess at hamstring laterally. DTM and Ionto done today. Discussed variations for hamstring stretching. Pt doing well with strenghtening, will benefit from continued care for pain at posterior knee.    Personal Factors and Comorbidities  Time since onset of injury/illness/exacerbation    Examination-Activity Limitations  Locomotion Level;Bend;Carry;Squat;Stairs;Stand    Examination-Participation Restrictions  Cleaning;Community Activity    Stability/Clinical Decision Making  Stable/Uncomplicated    Rehab Potential  Good    PT Frequency  2x / week    PT Duration  6 weeks    PT Treatment/Interventions  ADLs/Self Care Home Management;Cryotherapy;Electrical Stimulation;DME Instruction;Ultrasound;Traction;Moist Heat;Iontophoresis 4mg /ml Dexamethasone;Gait training;Stair training;Functional mobility training;Therapeutic activities;Therapeutic exercise;Balance training;Orthotic Fit/Training;Patient/family education;Neuromuscular re-education;Manual techniques;Passive range of motion;Dry needling;Spinal Manipulations;Taping;Joint Manipulations;Energy conservation    Consulted and Agree with Plan of Care  Patient       Patient will benefit from skilled therapeutic intervention in order to improve the following deficits and impairments:  Abnormal gait, Decreased range of motion, Increased muscle spasms, Decreased endurance, Decreased activity tolerance, Pain, Decreased balance, Impaired flexibility, Improper body mechanics, Decreased strength, Decreased mobility  Visit Diagnosis: 1. Chronic pain of left knee   2. Other abnormalities of gait and mobility        Problem List There are no active problems to display for this patient.  Lyndee Hensen, PT, DPT 4:00 PM  06/29/19    [facility], [place], [address] Phone: [phone]   Fax:  [phone]  Name: Gina Moss MRN: [id number] Date of Birth: Sep 29, 1991

## 2019-07-04 ENCOUNTER — Other Ambulatory Visit: Payer: Self-pay

## 2019-07-04 ENCOUNTER — Encounter: Payer: Self-pay | Admitting: Physical Therapy

## 2019-07-04 ENCOUNTER — Ambulatory Visit: Payer: No Typology Code available for payment source | Admitting: Physical Therapy

## 2019-07-04 DIAGNOSIS — R2689 Other abnormalities of gait and mobility: Secondary | ICD-10-CM | POA: Diagnosis not present

## 2019-07-04 DIAGNOSIS — M25562 Pain in left knee: Secondary | ICD-10-CM | POA: Diagnosis not present

## 2019-07-04 DIAGNOSIS — G8929 Other chronic pain: Secondary | ICD-10-CM

## 2019-07-04 NOTE — Therapy (Signed)
[facility] [address] [place], [place], [address] Phone: [phone]   Fax:  [phone]  Physical Therapy Treatment  Patient Details  Name: Gina Moss MRN: [id number] Date of Birth: 1991/07/27 Referring Provider (PT): Inda Coke, [place]   Encounter Date: 07/04/2019  PT End of Session - 07/04/19 1229    Visit Number  5    Number of Visits  12    Date for PT Re-Evaluation  08/01/19    Authorization Type  [facility] plan    PT Start Time  9735    PT Stop Time  1301    PT Time Calculation (min)  41 min    Activity Tolerance  Patient tolerated treatment well    Behavior During Therapy  [facility] for tasks assessed/performed       Past Medical History:  Diagnosis Date  . History of chicken pox   . Hydradenitis     cysts drained in 2018 and 2019  . IBS (irritable bowel syndrome) 2018    Past Surgical History:  Procedure Laterality Date  . TONSILLECTOMY AND ADENOIDECTOMY  2007  . WISDOM TOOTH EXTRACTION Bilateral 2007    There were no vitals filed for this visit.  Subjective Assessment - 07/04/19 1228    Subjective  Pt states  pain in posterior knee is somewhat better.    Limitations  Walking;House hold activities    Patient Stated Goals  Decreased pain, return to walking dog, return to bowling.    Currently in Pain?  Yes    Pain Score  4     Pain Location  Knee    Pain Descriptors / Indicators  Aching    Pain Type  Acute pain    Pain Onset  More than a month ago    Pain Frequency  Intermittent                       [facility] Adult PT Treatment/Exercise - 07/04/19 1230      Exercises   Exercises  Knee/Hip      Knee/Hip Exercises: Stretches   Active Hamstring Stretch  3 reps;30 seconds    Active Hamstring Stretch Limitations  supine w strap and ankle pump      Knee/Hip Exercises: Aerobic   Stationary Bike  L1 x 8 min      Knee/Hip Exercises: Standing   Hip Abduction  10 reps;Both;3 sets    Abduction  Limitations  YTB      Knee/Hip Exercises: Seated   Long Arc Quad  10 reps;2 sets;Both    Long Arc Quad Limitations  2.5 lb    Hamstring Curl  Left;20 reps    Hamstring Limitations  GTB      Knee/Hip Exercises: Supine   Bridges  --    Straight Leg Raises  --      Knee/Hip Exercises: Sidelying   Hip ABduction  --      Modalities   Modalities  --      Iontophoresis   Type of Iontophoresis  --    Location  --    Time  --      Manual Therapy   Manual Therapy  Soft tissue mobilization;Taping    Manual therapy comments  skilled palpation and monitoring of soft tissue with dry needling.     Soft tissue mobilization  DTM and IASTM to L hamstring       Trigger Point Dry Needling - 07/04/19 0001  Consent Given?  Yes    Education Handout Provided  Yes    Muscles Treated Lower Quadrant  Hamstring   L   Hamstring Response  Palpable increased muscle length;Twitch response elicited           PT Education - 07/04/19 1229    Education Details  Education on DN use, benefits, risks, precautions.    Person(s) Educated  Patient    Methods  Explanation    Comprehension  Verbalized understanding       PT Short Term Goals - 07/04/19 1230      PT SHORT TERM GOAL #1   Title  Pt to be independent with initial HEP    Time  2    Period  Weeks    Status  Achieved    Target Date  07/04/19        PT Long Term Goals - 06/20/19 1420      PT LONG TERM GOAL #1   Title  Pt to be independent with final HEP for hip and knee strength    Time  6    Period  Weeks    Status  New    Target Date  08/01/19      PT LONG TERM GOAL #2   Title  Pt to report decreased pain in L knee, to 0-2/10 with standing, walking and stairs.    Time  6    Period  Weeks    Status  New    Target Date  07/04/19      PT LONG TERM GOAL #3   Title  Pt to report ability for walking up to 1 mile with pain 2/10 or less.    Time  6    Period  Weeks    Status  New    Target Date  08/01/19      PT LONG  TERM GOAL #4   Title  Pt to demo improved strength of L hip and knee, to at least 4+/5 to improve stability and pain.    Time  6    Period  Weeks    Status  New    Target Date  08/01/19            Plan - 07/04/19 1415    Clinical Impression Statement  Pt with good tolerance for dry needling today. Lateral hamstring sore with IASTM and DTM. Pt showing progress with decreased pain and improving function overall. Was able to resume dog walking this weekend without increased pain. Plan to progress strength and continue manual and modalities as needed for pain.    Personal Factors and Comorbidities  Time since onset of injury/illness/exacerbation    Examination-Activity Limitations  Locomotion Level;Bend;Carry;Squat;Stairs;Stand    Examination-Participation Restrictions  Cleaning;Community Activity    Stability/Clinical Decision Making  Stable/Uncomplicated    Rehab Potential  Good    PT Frequency  2x / week    PT Duration  6 weeks    PT Treatment/Interventions  ADLs/Self Care Home Management;Cryotherapy;Electrical Stimulation;DME Instruction;Ultrasound;Traction;Moist Heat;Iontophoresis 4mg /ml Dexamethasone;Gait training;Stair training;Functional mobility training;Therapeutic activities;Therapeutic exercise;Balance training;Orthotic Fit/Training;Patient/family education;Neuromuscular re-education;Manual techniques;Passive range of motion;Dry needling;Spinal Manipulations;Taping;Joint Manipulations;Energy conservation    Consulted and Agree with Plan of Care  Patient       Patient will benefit from skilled therapeutic intervention in order to improve the following deficits and impairments:  Abnormal gait, Decreased range of motion, Increased muscle spasms, Decreased endurance, Decreased activity tolerance, Pain, Decreased balance, Impaired flexibility, Improper body mechanics, Decreased strength, Decreased mobility  Visit Diagnosis: 1. Chronic pain of left knee   2. Other abnormalities of  gait and mobility        Problem List There are no active problems to display for this patient.   Sedalia MutaLauren Vannary Greening, PT, DPT 2:16 PM  07/04/19    [facility] [address] [place], [place], [address] Phone: [phone]   Fax:  [phone]  Name: Enid CutterCharlotte Robey MRN: [id number] Date of Birth: 07-Dec-1990

## 2019-07-06 ENCOUNTER — Ambulatory Visit: Payer: No Typology Code available for payment source | Admitting: Physical Therapy

## 2019-07-06 ENCOUNTER — Other Ambulatory Visit: Payer: Self-pay

## 2019-07-06 DIAGNOSIS — M25562 Pain in left knee: Secondary | ICD-10-CM

## 2019-07-06 DIAGNOSIS — R2689 Other abnormalities of gait and mobility: Secondary | ICD-10-CM | POA: Diagnosis not present

## 2019-07-06 DIAGNOSIS — G8929 Other chronic pain: Secondary | ICD-10-CM | POA: Diagnosis not present

## 2019-07-10 ENCOUNTER — Encounter: Payer: Self-pay | Admitting: Physical Therapy

## 2019-07-10 NOTE — Therapy (Signed)
[facility] [address] [place], [place], [address] Phone: [phone]   Fax:  [phone]  Physical Therapy Treatment  Patient Details  Name: Gina CutterCharlotte Moss MRN: [id number] Date of Birth: 1991-08-26 Referring Provider (PT): Jarold MottoSamantha Worley, [place]   Encounter Date: 07/06/2019  PT End of Session - 07/10/19 0835    Visit Number  6    Number of Visits  12    Date for PT Re-Evaluation  08/01/19    Authorization Type  [facility] plan    PT Start Time  1430    PT Stop Time  1514    PT Time Calculation (min)  44 min    Activity Tolerance  Patient tolerated treatment well    Behavior During Therapy  [facility] for tasks assessed/performed       Past Medical History:  Diagnosis Date  . History of chicken pox   . Hydradenitis     cysts drained in 2018 and 2019  . IBS (irritable bowel syndrome) 2018    Past Surgical History:  Procedure Laterality Date  . TONSILLECTOMY AND ADENOIDECTOMY  2007  . WISDOM TOOTH EXTRACTION Bilateral 2007    There were no vitals filed for this visit.  Subjective Assessment - 07/10/19 0834    Subjective  Pt states HS is sore today. Pain overal is getting better.    Patient Stated Goals  Decreased pain, return to walking dog, return to bowling.    Currently in Pain?  Yes    Pain Score  3     Pain Location  Knee    Pain Orientation  Left    Pain Descriptors / Indicators  Aching    Pain Type  Acute pain    Pain Onset  More than a month ago    Pain Frequency  Intermittent                       [facility] Adult PT Treatment/Exercise - 07/10/19 0001      Exercises   Exercises  Knee/Hip      Knee/Hip Exercises: Stretches   Active Hamstring Stretch  3 reps;30 seconds    Active Hamstring Stretch Limitations  supine w strap and ankle pump      Knee/Hip Exercises: Aerobic   Stationary Bike  Ll2 x 4 min, L1 x4 min      Knee/Hip Exercises: Standing   Hip Abduction  10 reps;Both;3 sets    Abduction  Limitations  YTB    Hip Extension  10 reps;Both    Extension Limitations  YTB    Functional Squat  20 reps    Functional Squat Limitations  5lb    SLS  30 sec x2 bil;     Other Standing Knee Exercises  SLS with HS hinge/reach 2x5 bil;     Other Standing Knee Exercises  Walk/March 10 ft x4;       Knee/Hip Exercises: Seated   Long Arc Quad  10 reps;2 sets;Both    Long Arc Quad Limitations  2.5 lb    Hamstring Curl  Left;20 reps    Hamstring Limitations  GTB      Knee/Hip Exercises: Supine   Straight Leg Raises  20 reps;Both      Knee/Hip Exercises: Sidelying   Hip ABduction  20 reps;Both      Manual Therapy   Manual Therapy  Soft tissue mobilization;Taping    Soft tissue mobilization  DTM and IASTM to L hamstring  Kinesiotex  IT sales professional  2 I strips at anterior knee/patella tendon                PT Short Term Goals - 07/04/19 1230      PT SHORT TERM GOAL #1   Title  Pt to be independent with initial HEP    Time  2    Period  Weeks    Status  Achieved    Target Date  07/04/19        PT Long Term Goals - 06/20/19 1420      PT LONG TERM GOAL #1   Title  Pt to be independent with final HEP for hip and knee strength    Time  6    Period  Weeks    Status  New    Target Date  08/01/19      PT LONG TERM GOAL #2   Title  Pt to report decreased pain in L knee, to 0-2/10 with standing, walking and stairs.    Time  6    Period  Weeks    Status  New    Target Date  07/04/19      PT LONG TERM GOAL #3   Title  Pt to report ability for walking up to 1 mile with pain 2/10 or less.    Time  6    Period  Weeks    Status  New    Target Date  08/01/19      PT LONG TERM GOAL #4   Title  Pt to demo improved strength of L hip and knee, to at least 4+/5 to improve stability and pain.    Time  6    Period  Weeks    Status  New    Target Date  08/01/19            Plan - 07/10/19 0845    Clinical Impression Statement  Pt  with improving pain and function overall. She does continue to have soreness at L Hamstring. She has mild soreness at anterior knee at fat pad, taping done anteriorly today. Will continue manual, possibly more DN for HS.    Personal Factors and Comorbidities  Time since onset of injury/illness/exacerbation    Examination-Activity Limitations  Locomotion Level;Bend;Carry;Squat;Stairs;Stand    Examination-Participation Restrictions  Cleaning;Community Activity    Stability/Clinical Decision Making  Stable/Uncomplicated    Rehab Potential  Good    PT Frequency  2x / week    PT Duration  6 weeks    PT Treatment/Interventions  ADLs/Self Care Home Management;Cryotherapy;Electrical Stimulation;DME Instruction;Ultrasound;Traction;Moist Heat;Iontophoresis 4mg /ml Dexamethasone;Gait training;Stair training;Functional mobility training;Therapeutic activities;Therapeutic exercise;Balance training;Orthotic Fit/Training;Patient/family education;Neuromuscular re-education;Manual techniques;Passive range of motion;Dry needling;Spinal Manipulations;Taping;Joint Manipulations;Energy conservation    Consulted and Agree with Plan of Care  Patient       Patient will benefit from skilled therapeutic intervention in order to improve the following deficits and impairments:  Abnormal gait, Decreased range of motion, Increased muscle spasms, Decreased endurance, Decreased activity tolerance, Pain, Decreased balance, Impaired flexibility, Improper body mechanics, Decreased strength, Decreased mobility  Visit Diagnosis: 1. Chronic pain of left knee   2. Other abnormalities of gait and mobility        Problem List There are no active problems to display for this patient.  Lyndee Hensen, PT, DPT 8:50 AM  07/10/19    [facility] [address] [place], [place], [address] Phone:  [phone]   Fax:  [phone]  Name: Gina CutterCharlotte Moss MRN: [id number] Date of Birth:  1991-07-14

## 2019-07-13 ENCOUNTER — Other Ambulatory Visit: Payer: Self-pay

## 2019-07-13 ENCOUNTER — Encounter: Payer: Self-pay | Admitting: Physical Therapy

## 2019-07-13 ENCOUNTER — Ambulatory Visit (INDEPENDENT_AMBULATORY_CARE_PROVIDER_SITE_OTHER): Payer: No Typology Code available for payment source | Admitting: Physical Therapy

## 2019-07-13 DIAGNOSIS — M25562 Pain in left knee: Secondary | ICD-10-CM | POA: Diagnosis not present

## 2019-07-13 DIAGNOSIS — R2689 Other abnormalities of gait and mobility: Secondary | ICD-10-CM | POA: Diagnosis not present

## 2019-07-13 DIAGNOSIS — G8929 Other chronic pain: Secondary | ICD-10-CM | POA: Diagnosis not present

## 2019-07-17 ENCOUNTER — Other Ambulatory Visit: Payer: Self-pay

## 2019-07-17 ENCOUNTER — Ambulatory Visit: Payer: No Typology Code available for payment source | Admitting: Physical Therapy

## 2019-07-17 ENCOUNTER — Encounter: Payer: Self-pay | Admitting: Physical Therapy

## 2019-07-17 DIAGNOSIS — G8929 Other chronic pain: Secondary | ICD-10-CM

## 2019-07-17 DIAGNOSIS — M25562 Pain in left knee: Secondary | ICD-10-CM

## 2019-07-17 DIAGNOSIS — R2689 Other abnormalities of gait and mobility: Secondary | ICD-10-CM

## 2019-07-17 NOTE — Therapy (Signed)
[facility] [address] [place], [place], [address] Phone: [phone]   Fax:  [phone]  Physical Therapy Treatment  Patient Details  Name: Gina Moss MRN: [id number] Date of Birth: 1991-06-16 Referring Provider (PT): Jarold MottoSamantha Worley, [place]   Encounter Date: 07/17/2019  PT End of Session - 07/17/19 1111    Visit Number  8    Number of Visits  12    Date for PT Re-Evaluation  08/01/19    Authorization Type  [facility] plan    PT Start Time  1105    PT Stop Time  1144    PT Time Calculation (min)  39 min    Activity Tolerance  Patient tolerated treatment well    Behavior During Therapy  [facility] for tasks assessed/performed       Past Medical History:  Diagnosis Date  . History of chicken pox   . Hydradenitis     cysts drained in 2018 and 2019  . IBS (irritable bowel syndrome) 2018    Past Surgical History:  Procedure Laterality Date  . TONSILLECTOMY AND ADENOIDECTOMY  2007  . WISDOM TOOTH EXTRACTION Bilateral 2007    There were no vitals filed for this visit.  Subjective Assessment - 07/17/19 1110    Subjective  Pt states she is doing very well, very minimal/no pain over the weekend. Has been able to walk dog more.    Currently in Pain?  Yes    Pain Score  2     Pain Location  Knee    Pain Orientation  Left    Pain Descriptors / Indicators  Aching    Pain Type  Chronic pain    Pain Onset  More than a month ago    Pain Frequency  Intermittent                       [facility] Adult PT Treatment/Exercise - 07/17/19 1112      Exercises   Exercises  Knee/Hip      Knee/Hip Exercises: Aerobic   Stationary Bike  L1 x 8 min      Knee/Hip Exercises: Standing   Hip Abduction  10 reps;Both;2 sets    Abduction Limitations  YTB    Hip Extension  10 reps;Both    Forward Step Up  10 reps;Step Height: 6";Both    Functional Squat  20 reps    Functional Squat Limitations  10 lb    Other Standing Knee Exercises   SLS with UE ball lifts x10 bil;     Other Standing Knee Exercises  Walk/March 10 ft fwd/bwd x4 each ;       Knee/Hip Exercises: Seated   Hamstring Curl  Left;20 reps    Hamstring Limitations  GTB      Knee/Hip Exercises: Supine   Bridges  20 reps    Straight Leg Raises  20 reps;Both      Knee/Hip Exercises: Sidelying   Hip ABduction  --      Manual Therapy   Manual Therapy  Soft tissue mobilization;Taping    Manual therapy comments  --    Soft tissue mobilization  --               PT Short Term Goals - 07/04/19 1230      PT SHORT TERM GOAL #1   Title  Pt to be independent with initial HEP    Time  2    Period  Weeks  Status  Achieved    Target Date  07/04/19        PT Long Term Goals - 06/20/19 1420      PT LONG TERM GOAL #1   Title  Pt to be independent with final HEP for hip and knee strength    Time  6    Period  Weeks    Status  New    Target Date  08/01/19      PT LONG TERM GOAL #2   Title  Pt to report decreased pain in L knee, to 0-2/10 with standing, walking and stairs.    Time  6    Period  Weeks    Status  New    Target Date  07/04/19      PT LONG TERM GOAL #3   Title  Pt to report ability for walking up to 1 mile with pain 2/10 or less.    Time  6    Period  Weeks    Status  New    Target Date  08/01/19      PT LONG TERM GOAL #4   Title  Pt to demo improved strength of L hip and knee, to at least 4+/5 to improve stability and pain.    Time  6    Period  Weeks    Status  New    Target Date  08/01/19            Plan - 07/17/19 1205    Clinical Impression Statement  Pt showing good progress. Minimal pain in knee today with activity, but does have soreness to palpate posterior knee. Plan to see pt for 1-2 more visits, then d/c to HEP.    Personal Factors and Comorbidities  Time since onset of injury/illness/exacerbation    Examination-Activity Limitations  Locomotion Level;Bend;Carry;Squat;Stairs;Stand     Examination-Participation Restrictions  Cleaning;Community Activity    Stability/Clinical Decision Making  Stable/Uncomplicated    Rehab Potential  Good    PT Frequency  2x / week    PT Duration  6 weeks    PT Treatment/Interventions  ADLs/Self Care Home Management;Cryotherapy;Electrical Stimulation;DME Instruction;Ultrasound;Traction;Moist Heat;Iontophoresis 4mg /ml Dexamethasone;Gait training;Stair training;Functional mobility training;Therapeutic activities;Therapeutic exercise;Balance training;Orthotic Fit/Training;Patient/family education;Neuromuscular re-education;Manual techniques;Passive range of motion;Dry needling;Spinal Manipulations;Taping;Joint Manipulations;Energy conservation    Consulted and Agree with Plan of Care  Patient       Patient will benefit from skilled therapeutic intervention in order to improve the following deficits and impairments:  Abnormal gait, Decreased range of motion, Increased muscle spasms, Decreased endurance, Decreased activity tolerance, Pain, Decreased balance, Impaired flexibility, Improper body mechanics, Decreased strength, Decreased mobility  Visit Diagnosis: 1. Chronic pain of left knee   2. Other abnormalities of gait and mobility        Problem List There are no active problems to display for this patient.  Lyndee Hensen, PT, DPT 12:07 PM  07/17/19    [facility] [place], [place], [address] Phone: [phone]   Fax:  [phone]  Name: Gina Moss MRN: [id number] Date of Birth: 1991/04/29

## 2019-07-17 NOTE — Therapy (Signed)
[facility]  [facility] [address] [place], [place], [address] Phone: [phone]   Fax:  [phone]  Physical Therapy Treatment  Patient Details  Name: Enid CutterCharlotte Mcgivern MRN: [id number] Date of Birth: [birth year] Referring Provider (PT): Jarold MottoSamantha Worley, [place]   Encounter Date: 07/13/2019  PT End of Session - 07/17/19 0809    Visit Number  7    Number of Visits  12    Date for PT Re-Evaluation  08/01/19    Authorization Type  [facility] plan    PT Start Time  1208    PT Stop Time  1253    PT Time Calculation (min)  45 min    Activity Tolerance  Patient tolerated treatment well    Behavior During Therapy  [facility] for tasks assessed/performed       Past Medical History:  Diagnosis Date  . History of chicken pox   . Hydradenitis     cysts drained in 2018 and 2019  . IBS (irritable bowel syndrome) 2018    Past Surgical History:  Procedure Laterality Date  . TONSILLECTOMY AND ADENOIDECTOMY  2007  . WISDOM TOOTH EXTRACTION Bilateral 2007    There were no vitals filed for this visit.  Subjective Assessment - 07/17/19 0809    Subjective  Pt states overall knee is much better. She did have increased soreness in back of knee yesterday, but better today. She is doing more walking and activity overall.    Patient Stated Goals  Decreased pain, return to walking dog, return to bowling.    Currently in Pain?  Yes    Pain Score  4     Pain Location  Knee    Pain Orientation  Left    Pain Descriptors / Indicators  Aching    Pain Type  Acute pain    Pain Onset  More than a month ago    Pain Frequency  Intermittent                       [facility] Adult PT Treatment/Exercise - 07/17/19 0001      Exercises   Exercises  Knee/Hip      Knee/Hip Exercises: Aerobic   Stationary Bike  L1 x 8 min      Knee/Hip Exercises: Standing   Hip Abduction  10 reps;Both;2 sets    Abduction Limitations  YTB    Hip Extension  10 reps;Both    Functional  Squat  20 reps    Functional Squat Limitations  10 lb    Other Standing Knee Exercises  Walk/March 10 ft x4;       Knee/Hip Exercises: Seated   Hamstring Curl  Left;20 reps    Hamstring Limitations  GTB      Knee/Hip Exercises: Supine   Bridges  20 reps    Straight Leg Raises  20 reps;Both      Knee/Hip Exercises: Sidelying   Hip ABduction  20 reps;Both      Manual Therapy   Manual Therapy  Soft tissue mobilization;Taping    Manual therapy comments  skilled palpation and monitoring of soft tissue with dry needling.     Soft tissue mobilization  DTM and IASTM to L hamstring       Trigger Point Dry Needling - 07/17/19 0001    Consent Given?  Yes    Education Handout Provided  Previously provided    Muscles Treated Lower Quadrant  Hamstring    Hamstring Response  Palpable  increased muscle length   L          PT Education - 07/17/19 0809    Education Details  HEP reviewed    Person(s) Educated  Patient    Methods  Explanation    Comprehension  Verbalized understanding       PT Short Term Goals - 07/04/19 1230      PT SHORT TERM GOAL #1   Title  Pt to be independent with initial HEP    Time  2    Period  Weeks    Status  Achieved    Target Date  07/04/19        PT Long Term Goals - 06/20/19 1420      PT LONG TERM GOAL #1   Title  Pt to be independent with final HEP for hip and knee strength    Time  6    Period  Weeks    Status  New    Target Date  08/01/19      PT LONG TERM GOAL #2   Title  Pt to report decreased pain in L knee, to 0-2/10 with standing, walking and stairs.    Time  6    Period  Weeks    Status  New    Target Date  07/04/19      PT LONG TERM GOAL #3   Title  Pt to report ability for walking up to 1 mile with pain 2/10 or less.    Time  6    Period  Weeks    Status  New    Target Date  08/01/19      PT LONG TERM GOAL #4   Title  Pt to demo improved strength of L hip and knee, to at least 4+/5 to improve stability and pain.     Time  6    Period  Weeks    Status  New    Target Date  08/01/19            Plan - 07/17/19 0811    Clinical Impression Statement  Pt with improving pain, and improving function. She does have tenderness in HS region, as well as deep in popliteal fossa. She has been able to increase activity level, and reports overall decreased pain. Pt to be seen for 1-2 more weeks, then plan to d/c to HEP.    Personal Factors and Comorbidities  Time since onset of injury/illness/exacerbation    Examination-Activity Limitations  Locomotion Level;Bend;Carry;Squat;Stairs;Stand    Examination-Participation Restrictions  Cleaning;Community Activity    Stability/Clinical Decision Making  Stable/Uncomplicated    Rehab Potential  Good    PT Frequency  2x / week    PT Duration  6 weeks    PT Treatment/Interventions  ADLs/Self Care Home Management;Cryotherapy;Electrical Stimulation;DME Instruction;Ultrasound;Traction;Moist Heat;Iontophoresis 4mg /ml Dexamethasone;Gait training;Stair training;Functional mobility training;Therapeutic activities;Therapeutic exercise;Balance training;Orthotic Fit/Training;Patient/family education;Neuromuscular re-education;Manual techniques;Passive range of motion;Dry needling;Spinal Manipulations;Taping;Joint Manipulations;Energy conservation    Consulted and Agree with Plan of Care  Patient       Patient will benefit from skilled therapeutic intervention in order to improve the following deficits and impairments:  Abnormal gait, Decreased range of motion, Increased muscle spasms, Decreased endurance, Decreased activity tolerance, Pain, Decreased balance, Impaired flexibility, Improper body mechanics, Decreased strength, Decreased mobility  Visit Diagnosis: 1. Chronic pain of left knee   2. Other abnormalities of gait and mobility        Problem List There are no active problems to display for this  patient.   Lyndee Hensen, PT, DPT 8:13 AM  07/17/19    [facility]  [place], [place], [address] Phone: [phone]   Fax:  [phone]  Name: Symia Herdt MRN: [id number] Date of Birth: March 21, 1991

## 2019-07-18 ENCOUNTER — Encounter: Payer: No Typology Code available for payment source | Admitting: Physical Therapy

## 2019-07-20 ENCOUNTER — Ambulatory Visit: Payer: No Typology Code available for payment source | Admitting: Physical Therapy

## 2019-07-20 ENCOUNTER — Other Ambulatory Visit: Payer: Self-pay

## 2019-07-20 DIAGNOSIS — M25562 Pain in left knee: Secondary | ICD-10-CM | POA: Diagnosis not present

## 2019-07-20 DIAGNOSIS — G8929 Other chronic pain: Secondary | ICD-10-CM

## 2019-07-20 DIAGNOSIS — R2689 Other abnormalities of gait and mobility: Secondary | ICD-10-CM

## 2019-07-20 NOTE — Patient Instructions (Signed)
Access Code: [email or web address]  URL: [email or web address]  Date: 07/20/2019  Prepared by: Tysheka Fanguy   Exercises Seated Hamstring Stretch - 3 reps - 30 hold - 2x daily Straight Leg Raise - 10 reps - 2 sets - 1x daily Sidelying Hip Abduction - 10 reps - 2 sets - 1x daily Supine Bridge - 10 reps - 2 sets - 1x daily Seated Knee Extension AROM - 10 reps - 2 sets - 2x daily Seated Hamstring Curl with Anchored Resistance - 10 reps - 2 sets - 1x daily Standing Repeated Hip Abduction with Resistance - 10 reps - 2 sets - 1x daily Standing Repeated Hip Extension with Resistance - 10 reps - 2 sets - 1x daily Squat with Chair Touch - 10 reps - 1 sets - 1x daily 

## 2019-07-20 NOTE — Therapy (Signed)
[facility] [address] [place], [place], [address] Phone: [phone]   Fax:  [phone]  Physical Therapy Treatment  Patient Details  Name: Gina Moss MRN: [id number] Date of Birth: Aug 26, 1991 Referring Provider (PT): Inda Coke, [place]   Encounter Date: 07/20/2019    Past Medical History:  Diagnosis Date  . History of chicken pox   . Hydradenitis     cysts drained in 2018 and 2019  . IBS (irritable bowel syndrome) 2018    Past Surgical History:  Procedure Laterality Date  . TONSILLECTOMY AND ADENOIDECTOMY  2007  . WISDOM TOOTH EXTRACTION Bilateral 2007    There were no vitals filed for this visit.                    [facility] Adult PT Treatment/Exercise - 07/20/19 1111      Exercises   Exercises  Knee/Hip      Knee/Hip Exercises: Stretches   Active Hamstring Stretch  3 reps;30 seconds    Active Hamstring Stretch Limitations  seated      Knee/Hip Exercises: Aerobic   Stationary Bike  L2 x4 min, L1 x4 min       Knee/Hip Exercises: Standing   Hip Abduction  10 reps;Both;2 sets    Abduction Limitations  YTB    Hip Extension  10 reps;Both    Extension Limitations  YTB    Forward Step Up  --    Functional Squat  20 reps    Functional Squat Limitations  10 lb    Other Standing Knee Exercises  SLS with UE flexion x10 bil;     Other Standing Knee Exercises  Walk/March 10 ft fwd/bwd x4 each ;       Knee/Hip Exercises: Seated   Hamstring Curl  Left;20 reps    Hamstring Limitations  GTB      Knee/Hip Exercises: Supine   Bridges  --    Bridges with Clamshell  20 reps    Straight Leg Raises  --    Other Supine Knee/Hip Exercises  Clam GTB x20 alternating       Knee/Hip Exercises: Sidelying   Hip ABduction  20 reps;Both      Manual Therapy   Manual Therapy  Soft tissue mobilization;Taping    Manual therapy comments  skilled palpation and monitoring of soft tissue with dry needling.         Trigger Point Dry Needling - 07/20/19 0001    Consent Given?  Yes    Education Handout Provided  Previously provided    Muscles Treated Lower Quadrant  Hamstring    Hamstring Response  Palpable increased muscle length   L          PT Education - 07/20/19 1140    Education Details  HEP updated, reviewed.    Person(s) Educated  Patient    Methods  Explanation;Demonstration;Tactile cues;Handout    Comprehension  Verbalized understanding;Returned demonstration;Verbal cues required       PT Short Term Goals - 07/04/19 1230      PT SHORT TERM GOAL #1   Title  Pt to be independent with initial HEP    Time  2    Period  Weeks    Status  Achieved    Target Date  07/04/19        PT Long Term Goals - 06/20/19 1420      PT LONG TERM GOAL #1   Title  Pt to be independent with  final HEP for hip and knee strength    Time  6    Period  Weeks    Status  New    Target Date  08/01/19      PT LONG TERM GOAL #2   Title  Pt to report decreased pain in L knee, to 0-2/10 with standing, walking and stairs.    Time  6    Period  Weeks    Status  New    Target Date  07/04/19      PT LONG TERM GOAL #3   Title  Pt to report ability for walking up to 1 mile with pain 2/10 or less.    Time  6    Period  Weeks    Status  New    Target Date  08/01/19      PT LONG TERM GOAL #4   Title  Pt to demo improved strength of L hip and knee, to at least 4+/5 to improve stability and pain.    Time  6    Period  Weeks    Status  New    Target Date  08/01/19            Plan - 07/20/19 1207    Clinical Impression Statement  Pt progressing well with pain and function. Plan to d/c at next visit. Hep reviewed today. Pt with mild soreness at HS, DN done today, pt with good response. She has soreness at posterior knee/popliteal region but mostly with palpation vs activity.    Personal Factors and Comorbidities  Time since onset of injury/illness/exacerbation    Examination-Activity  Limitations  Locomotion Level;Bend;Carry;Squat;Stairs;Stand    Examination-Participation Restrictions  Cleaning;Community Activity    Stability/Clinical Decision Making  Stable/Uncomplicated    Rehab Potential  Good    PT Frequency  2x / week    PT Duration  6 weeks    PT Treatment/Interventions  ADLs/Self Care Home Management;Cryotherapy;Electrical Stimulation;DME Instruction;Ultrasound;Traction;Moist Heat;Iontophoresis 4mg /ml Dexamethasone;Gait training;Stair training;Functional mobility training;Therapeutic activities;Therapeutic exercise;Balance training;Orthotic Fit/Training;Patient/family education;Neuromuscular re-education;Manual techniques;Passive range of motion;Dry needling;Spinal Manipulations;Taping;Joint Manipulations;Energy conservation    Consulted and Agree with Plan of Care  Patient       Patient will benefit from skilled therapeutic intervention in order to improve the following deficits and impairments:  Abnormal gait, Decreased range of motion, Increased muscle spasms, Decreased endurance, Decreased activity tolerance, Pain, Decreased balance, Impaired flexibility, Improper body mechanics, Decreased strength, Decreased mobility  Visit Diagnosis: Chronic pain of left knee  Other abnormalities of gait and mobility     Problem List There are no active problems to display for this patient.   Gina Moss, PT, DPT 12:09 PM  07/20/19    [facility] [place] [facility] [address] [place], [place], [address] Phone: [phone]   Fax:  [phone]  Name: Gina Moss MRN: [id number] Date of Birth: 11-28-1991

## 2019-07-25 ENCOUNTER — Ambulatory Visit: Payer: No Typology Code available for payment source | Admitting: Physical Therapy

## 2019-07-25 ENCOUNTER — Encounter: Payer: Self-pay | Admitting: Physical Therapy

## 2019-07-25 ENCOUNTER — Other Ambulatory Visit: Payer: Self-pay

## 2019-07-25 DIAGNOSIS — R2689 Other abnormalities of gait and mobility: Secondary | ICD-10-CM

## 2019-07-25 DIAGNOSIS — G8929 Other chronic pain: Secondary | ICD-10-CM | POA: Diagnosis not present

## 2019-07-25 DIAGNOSIS — M25562 Pain in left knee: Secondary | ICD-10-CM

## 2019-07-25 NOTE — Patient Instructions (Signed)
Access Code: [email or web address]  URL: [email or web address]  Date: 07/20/2019  Prepared by: Lyndee Hensen   Exercises Seated Hamstring Stretch - 3 reps - 30 hold - 2x daily Straight Leg Raise - 10 reps - 2 sets - 1x daily Sidelying Hip Abduction - 10 reps - 2 sets - 1x daily Supine Bridge - 10 reps - 2 sets - 1x daily Seated Knee Extension AROM - 10 reps - 2 sets - 2x daily Seated Hamstring Curl with Anchored Resistance - 10 reps - 2 sets - 1x daily Standing Repeated Hip Abduction with Resistance - 10 reps - 2 sets - 1x daily Standing Repeated Hip Extension with Resistance - 10 reps - 2 sets - 1x daily Squat with Chair Touch - 10 reps - 1 sets - 1x daily

## 2019-07-25 NOTE — Therapy (Signed)
[facility] [address] [place], [place], [address] Phone: [phone]   Fax:  [phone]  Physical Therapy Treatment/Discharge   Patient Details  Name: Gina Moss MRN: [id number] Date of Birth: 08-18-1991 Referring Provider (PT): Inda Coke, [place]   Encounter Date: 07/25/2019  PT End of Session - 07/25/19 1303    Visit Number  9    Number of Visits  12    Date for PT Re-Evaluation  08/01/19    Authorization Type  [facility] plan    PT Start Time  1300    PT Stop Time  1340    PT Time Calculation (min)  40 min    Activity Tolerance  Patient tolerated treatment well    Behavior During Therapy  [facility] for tasks assessed/performed       Past Medical History:  Diagnosis Date  . History of chicken pox   . Hydradenitis     cysts drained in 2018 and 2019  . IBS (irritable bowel syndrome) 2018    Past Surgical History:  Procedure Laterality Date  . TONSILLECTOMY AND ADENOIDECTOMY  2007  . WISDOM TOOTH EXTRACTION Bilateral 2007    There were no vitals filed for this visit.  Subjective Assessment - 07/25/19 1304    Subjective  Pt overall doing well, but posterior knee a bit more sore in last two days, pt has been working more this week, has not been able to do as much of HEP    Currently in Pain?  Yes    Pain Score  3     Pain Location  Knee    Pain Orientation  Left    Pain Descriptors / Indicators  Aching    Pain Type  Chronic pain    Pain Onset  More than a month ago    Pain Frequency  Intermittent         [facility] PT Assessment - 07/25/19 0001      Strength   Overall Strength Comments  5/5 Bil knee/ 4+/5 bil hips                   [facility] Adult PT Treatment/Exercise - 07/25/19 1306      Exercises   Exercises  Knee/Hip      Knee/Hip Exercises: Stretches   Active Hamstring Stretch  3 reps;30 seconds    Active Hamstring Stretch Limitations  seated      Knee/Hip Exercises: Aerobic   Stationary Bike   L2 x4 min, L1 x4 min       Knee/Hip Exercises: Standing   Hip Abduction  10 reps;Both;2 sets    Abduction Limitations  YTB    Hip Extension  10 reps;Both    Extension Limitations  YTB    Functional Squat  --    Functional Squat Limitations  --    Other Standing Knee Exercises  SLS with UE flexion x10 bil;     Other Standing Knee Exercises  Walk/March 10 ft fwd/bwd x4 each ; Bwd walking 30 ft x4      Knee/Hip Exercises: Seated   Hamstring Curl  Left;20 reps    Hamstring Limitations  GTB      Knee/Hip Exercises: Supine   Bridges with Clamshell  20 reps    Other Supine Knee/Hip Exercises  Clam GTB x20 alternating       Knee/Hip Exercises: Sidelying   Hip ABduction  --      Manual Therapy   Manual Therapy  Soft  tissue mobilization;Taping    Manual therapy comments  --    Soft tissue mobilization  Manual stretching for L HS.             PT Education - 07/25/19 1303    Education Details  Final HEP reviewed    Person(s) Educated  Patient    Methods  Explanation;Demonstration;Handout    Comprehension  Verbalized understanding;Returned demonstration       PT Short Term Goals - 07/04/19 1230      PT SHORT TERM GOAL #1   Title  Pt to be independent with initial HEP    Time  2    Period  Weeks    Status  Achieved    Target Date  07/04/19        PT Long Term Goals - 07/25/19 1305      PT LONG TERM GOAL #1   Title  Pt to be independent with final HEP for hip and knee strength    Time  6    Period  Weeks    Status  Achieved      PT LONG TERM GOAL #2   Title  Pt to report decreased pain in L knee, to 0-2/10 with standing, walking and stairs.    Time  6    Period  Weeks    Status  Achieved      PT LONG TERM GOAL #3   Title  Pt to report ability for walking up to 1 mile with pain 2/10 or less.    Time  6    Period  Weeks    Status  Achieved      PT LONG TERM GOAL #4   Title  Pt to demo improved strength of L hip and knee, to at least 4+/5 to improve  stability and pain.    Time  6    Period  Weeks    Status  Achieved            Plan - 07/25/19 1359    Clinical Impression Statement  Pt mas met most goals. She is ready for d/c to HEP. Final HEP reviewed today. Pt with improved strength of hip and knee. She does have mild soreness wtih palpation of posterior knee, but overall pain is much improved. If pain increases or returns, pt will call and make an appt with Sports med. Pt in agreement with plan.    Personal Factors and Comorbidities  Time since onset of injury/illness/exacerbation    Examination-Activity Limitations  Locomotion Level;Bend;Carry;Squat;Stairs;Stand    Examination-Participation Restrictions  Cleaning;Community Activity    Stability/Clinical Decision Making  Stable/Uncomplicated    Rehab Potential  Good    PT Frequency  2x / week    PT Duration  6 weeks    PT Treatment/Interventions  ADLs/Self Care Home Management;Cryotherapy;Electrical Stimulation;DME Instruction;Ultrasound;Traction;Moist Heat;Iontophoresis 70m/ml Dexamethasone;Gait training;Stair training;Functional mobility training;Therapeutic activities;Therapeutic exercise;Balance training;Orthotic Fit/Training;Patient/family education;Neuromuscular re-education;Manual techniques;Passive range of motion;Dry needling;Spinal Manipulations;Taping;Joint Manipulations;Energy conservation    Consulted and Agree with Plan of Care  Patient       Patient will benefit from skilled therapeutic intervention in order to improve the following deficits and impairments:  Abnormal gait, Decreased range of motion, Increased muscle spasms, Decreased endurance, Decreased activity tolerance, Pain, Decreased balance, Impaired flexibility, Improper body mechanics, Decreased strength, Decreased mobility  Visit Diagnosis: Chronic pain of left knee  Other abnormalities of gait and mobility     Problem List There are no active problems to display for this patient.  Lyndee Hensen, PT, DPT 2:04 PM  07/25/19    [facility] [place], [place], [address] Phone: [phone]   Fax:  [phone]  Name: Keeana Pieratt MRN: [id number] Date of Birth: 08-10-1991   PHYSICAL THERAPY DISCHARGE SUMMARY  Visits from Start of Care: 9  Plan: Patient agrees to discharge.  Patient goals were met. Patient is being discharged due to meeting the stated rehab goals.  ?????     Lyndee Hensen, PT, DPT 2:04 PM  07/25/19

## 2019-08-28 ENCOUNTER — Telehealth: Payer: Self-pay | Admitting: Medical

## 2019-08-28 NOTE — Telephone Encounter (Signed)
Attempted to call patient about her appointment on 9/29 @ 8:55. No answer left voicemail instructing patient to wear a face mask for the entire appointment and no visitors are allowed during the visit. Patient instructed not to attend the appointment if she was any symptoms. Symptom list and office number left.

## 2019-08-29 ENCOUNTER — Ambulatory Visit (INDEPENDENT_AMBULATORY_CARE_PROVIDER_SITE_OTHER): Payer: No Typology Code available for payment source | Admitting: Medical

## 2019-08-29 ENCOUNTER — Encounter: Payer: Self-pay | Admitting: Medical

## 2019-08-29 ENCOUNTER — Other Ambulatory Visit: Payer: Self-pay

## 2019-08-29 VITALS — BP 125/83 | HR 96 | Temp 98.0°F | Ht 64.0 in | Wt 217.0 lb

## 2019-08-29 DIAGNOSIS — Z124 Encounter for screening for malignant neoplasm of cervix: Secondary | ICD-10-CM | POA: Diagnosis not present

## 2019-08-29 DIAGNOSIS — Z01419 Encounter for gynecological examination (general) (routine) without abnormal findings: Secondary | ICD-10-CM | POA: Diagnosis not present

## 2019-08-29 DIAGNOSIS — K589 Irritable bowel syndrome without diarrhea: Secondary | ICD-10-CM | POA: Insufficient documentation

## 2019-08-29 DIAGNOSIS — K582 Mixed irritable bowel syndrome: Secondary | ICD-10-CM

## 2019-08-29 DIAGNOSIS — Z1151 Encounter for screening for human papillomavirus (HPV): Secondary | ICD-10-CM

## 2019-08-29 DIAGNOSIS — L732 Hidradenitis suppurativa: Secondary | ICD-10-CM | POA: Insufficient documentation

## 2019-08-29 NOTE — Progress Notes (Signed)
  History:  [name] is a 28 y.o. G0P0000 who presents to clinic today for annual exam with pap smear. Last pap smear was 2-3 years ago at outside office. The patient denies any GYN concerns. She states she has regular periods monthly lasting ~ 3 days. She is not currently on birth control. She is sexually active and her fiance has had a vasectomy. She declines STD testing today.    The following portions of the patient's history were reviewed and updated as appropriate: allergies, current medications, family history, past medical history, social history, past surgical history and problem list.  Review of Systems:  Review of Systems  Constitutional: Negative for fever and malaise/fatigue.  Gastrointestinal: Negative for abdominal pain, constipation, diarrhea, nausea and vomiting.  Genitourinary: Negative for dysuria, frequency and urgency.       Neg - vaginal bleeding, discharge      Objective:  Physical Exam BP 125/83   Pulse 96   Temp 98 F (36.7 C)   Ht 5\' 4"  (1.626 m)   Wt 217 lb (98.4 kg)   LMP 08/04/2019 (Exact Date)   BMI 37.25 kg/m  Physical Exam  Nursing note and vitals reviewed. Constitutional: She is oriented to person, place, and time. She appears well-developed and well-nourished. No distress.  HENT:  Head: Normocephalic and atraumatic.  Eyes: EOM are normal.  Neck: Normal range of motion. Neck supple. No thyromegaly present.  Cardiovascular: Normal rate, regular rhythm and normal heart sounds.  No murmur heard. Respiratory: Effort normal and breath sounds normal. No respiratory distress. She has no wheezes.  GI: Soft. Bowel sounds are normal. She exhibits no distension and no mass. There is no abdominal tenderness. There is no rebound and no guarding.  Genitourinary: Uterus is not enlarged and not tender. Cervix exhibits no motion tenderness, no discharge and no friability. Right adnexum displays no mass and no tenderness. Left adnexum displays no mass and  no tenderness.    Vaginal discharge (scant white) present.     No vaginal bleeding.  No bleeding in the vagina.  Neurological: She is alert and oriented to person, place, and time.  Skin: Skin is warm and dry. No erythema.  Psychiatric: She has a normal mood and affect.    Assessment & Plan:  1. Well woman exam with routine gynecological exam - Cytology - PAP( [place]) - Patient will be contacted with any abnormal results  - Patient may follow-up for annual physicals with PCP until next pap is due, likely in 3 years - Patient may return to [facility] for any GYN concerns   Danielle Rankin 08/29/2019 9:19 AM

## 2019-08-29 NOTE — Patient Instructions (Signed)

## 2019-08-30 ENCOUNTER — Encounter: Payer: Self-pay | Admitting: Physician Assistant

## 2019-08-30 NOTE — Telephone Encounter (Signed)
Lauren, please see message.

## 2019-09-01 LAB — CYTOLOGY - PAP
Adequacy: ABSENT
Diagnosis: NEGATIVE
High risk HPV: NEGATIVE

## 2019-09-04 NOTE — Therapy (Addendum)
[facility] [address] [place], [place], [address] Phone: [phone]   Fax:  [phone]  Patient Details  Name: Gina Moss MRN: [id number] Date of Birth: 10-04-1991 Referring Provider:  Inda Coke, PA  Encounter Date: 07/25/2019       Stephanie Acre attended Physical Therapy for L knee pain from dates:  06/20/19 To 07/25/19.     Lyndee Hensen, PT, DPT 2:13 PM  09/04/19    [place], [place], [address] Phone: [phone]   Fax:  [phone]

## 2019-10-05 ENCOUNTER — Other Ambulatory Visit: Payer: Self-pay | Admitting: Physician Assistant

## 2019-10-05 ENCOUNTER — Telehealth: Payer: No Typology Code available for payment source | Admitting: Emergency Medicine

## 2019-10-05 DIAGNOSIS — L251 Unspecified contact dermatitis due to drugs in contact with skin: Secondary | ICD-10-CM

## 2019-10-05 MED ORDER — TRIAMCINOLONE ACETONIDE 0.025 % EX OINT: 1.0000 "application " | TOPICAL_OINTMENT | Freq: Two times a day (BID) | CUTANEOUS | 1 refills | Status: DC

## 2019-10-05 MED ORDER — METHYLPREDNISOLONE 4 MG PO TBPK: ORAL_TABLET | ORAL | 0 refills | Status: DC

## 2019-10-05 NOTE — Progress Notes (Signed)
E Visit for Rash  We are sorry that you are not feeling well. Here is how we plan to help!  Based on what you shared with me it looks like you have contact dermatitis.  Contact dermatitis is a skin rash caused by something that touches the skin and causes irritation or inflammation.  Your skin may be red, swollen, dry, cracked, and itch.  The rash should go away in a few days but can last a few weeks.  If you get a rash, it's important to figure out what caused it so the irritant can be avoided in the future.   I have prescribed a medrol taper - use as directed for 5 days As well as Kenalog Ointment 0.015% apply to the affected area twice daily.   HOME CARE:   Take cool showers and avoid direct sunlight.  Apply cool compress or wet dressings.  Take a bath in an oatmeal bath.  Sprinkle content of one Aveeno packet under running faucet with comfortably warm water.  Bathe for 15-20 minutes, 1-2 times daily.  Pat dry with a towel. Do not rub the rash.  Use hydrocortisone cream.  Take an antihistamine like Benadryl for widespread rashes that itch.  The adult dose of Benadryl is 25-50 mg by mouth 4 times daily.  Caution:  This type of medication may cause sleepiness.  Do not drink alcohol, drive, or operate dangerous machinery while taking antihistamines.  Do not take these medications if you have prostate enlargement.  Read package instructions thoroughly on all medications that you take.  GET HELP RIGHT AWAY IF:   Symptoms don't go away after treatment.  Severe itching that persists.  If you rash spreads or swells.  If you rash begins to smell.  If it blisters and opens or develops a yellow-brown crust.  You develop a fever.  You have a sore throat.  You become short of breath.  MAKE SURE YOU:  Understand these instructions. Will watch your condition. Will get help right away if you are not doing well or get worse.  Thank you for choosing an e-visit. Your e-visit  answers were reviewed by a board certified advanced clinical practitioner to complete your personal care plan. Depending upon the condition, your plan could have included both over the counter or prescription medications. Please review your pharmacy choice. Be sure that the pharmacy you have chosen is open so that you can pick up your prescription now.  If there is a problem you may message your provider in [place] to have the prescription routed to another pharmacy. Your safety is important to [place]. If you have drug allergies check your prescription carefully.  For the next 24 hours, you can use MyChart to ask questions about today's visit, request a non-urgent call back, or ask for a work or school excuse from your e-visit provider. You will get an email in the next two days asking about your experience. I hope that your e-visit has been valuable and will speed your recovery.    Greater than 5 but less than 10 minutes spent researching, coordinating, and implementing care for this patient today

## 2019-10-05 NOTE — Telephone Encounter (Signed)
Medication: methylPREDNISolone (MEDROL DOSEPAK) 4 MG TBPK tablet [[id number]] , triamcinolone (KENALOG) 0.025 % ointment [[id number]] - Was not received by the pharmacy  Has the patient contacted their pharmacy? Yes  (Agent: If no, request that the patient contact the pharmacy for the refill.) (Agent: If yes, when and what did the pharmacy advise?)  Preferred Pharmacy (with phone number or street name): [id number] - [place], [place] - [address] AT [place] [phone] (Phone) [phone] (Fax)    Agent: Please be advised that RX refills may take up to 3 business days. We ask that you follow-up with your pharmacy.

## 2019-10-10 ENCOUNTER — Other Ambulatory Visit: Payer: Self-pay | Admitting: Physical Therapy

## 2019-10-10 ENCOUNTER — Ambulatory Visit: Payer: No Typology Code available for payment source | Admitting: Family Medicine

## 2019-10-10 ENCOUNTER — Other Ambulatory Visit: Payer: Self-pay

## 2019-10-10 ENCOUNTER — Ambulatory Visit (INDEPENDENT_AMBULATORY_CARE_PROVIDER_SITE_OTHER): Payer: No Typology Code available for payment source

## 2019-10-10 ENCOUNTER — Encounter: Payer: Self-pay | Admitting: Family Medicine

## 2019-10-10 VITALS — BP 120/80 | HR 98 | Ht 64.0 in | Wt 220.4 lb

## 2019-10-10 DIAGNOSIS — M79675 Pain in left toe(s): Secondary | ICD-10-CM | POA: Diagnosis not present

## 2019-10-10 DIAGNOSIS — S92515A Nondisplaced fracture of proximal phalanx of left lesser toe(s), initial encounter for closed fracture: Secondary | ICD-10-CM | POA: Diagnosis not present

## 2019-10-10 NOTE — Progress Notes (Signed)
Subjective:    CC: L 5th toe pain  I, Molly Weber, LAT, ATC, am serving as scribe for Dr. Lynne Leader.  HPI: Pt is a 28 y/o female presenting w/ c/o L 5th toe pain x one week after kicking the leg of her sofa.  Pt denies any bruising but states that it is still swollen.  Pt rates her L 5th toe pain at a 5-6/10 at it's worst and a 2-3/10 uncomfortable pain on average.  She denies any deformity.  She has tried ice, rest and buddy taping.  She denies any paresthesias but does note a warmth to the toe.  She is wearing moccasins because they are a bit more comfortable than traditional shoes.  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  Past medical history, Surgical history, Family history not pertinant except as noted below, Social history, Allergies, and medications have been entered into the medical record, reviewed, and no changes needed.   Review of Systems: No headache, visual changes, nausea, vomiting, diarrhea, constipation, dizziness, abdominal pain, skin rash, fevers, chills, night sweats, weight loss, swollen lymph nodes, body aches, joint swelling, muscle aches, chest pain, shortness of breath, mood changes, visual or auditory hallucinations.   Objective:    Vitals:   10/10/19 1512  BP: 120/80  Pulse: 98  SpO2: 98%   General: Well Developed, well nourished, and in no acute distress.  Neuro/Psych: Alert and oriented x3, extra-ocular muscles intact, able to move all 4 extremities, sensation grossly intact. Skin: Warm and dry, no rashes noted.  Respiratory: Not using accessory muscles, speaking in full sentences, trachea midline.  Cardiovascular: Pulses palpable, no extremity edema. Abdomen: Does not appear distended. MSK: Left foot largely normal-appearing with slight swelling and slight redness at the fifth toe.  No significant deformities. Foot is nontender with the exception of the distal  portion of the fifth metatarsal and the fifth MTP and fifth toe. Pulses cap refill and sensation are intact distally.  Lab and Radiology Results X-ray images left foot obtained today personally and independently reviewed Oblique/spiral fracture present at proximal phalanx involving the PIP joint.  No significant displacement.  No other fractures visible into the foot. Await formal radiology review  Impression and Recommendations:    Assessment and Plan: 28 y.o. female with left fifth toe fracture.  Plan to treat with buddy tape postop shoe relative rest.  Recheck back in 3 weeks.  Return sooner if needed.  We will plan to get an x-ray next check.Marland Kitchen  PDMP not reviewed this encounter. Orders Placed This Encounter  Procedures  . DG Foot Complete Left    Standing Status:   Future    Number of Occurrences:   1    Standing Expiration Date:   12/09/2020    Order Specific Question:   Reason for Exam (SYMPTOM  OR DIAGNOSIS REQUIRED)    Answer:   eval possible 5th toe fracture    Order Specific Question:   Is patient pregnant?    Answer:   No    Order Specific Question:   Preferred imaging location?    Answer:   [facility]    Order Specific Question:   Radiology Contrast Protocol - do NOT remove file path    Answer:   [email or web address]   No orders of the defined types were placed in this encounter.   Discussed warning signs or symptoms. Please see discharge  instructions. Patient expresses understanding.   The above documentation has been reviewed and is accurate and complete Clementeen Graham

## 2019-10-10 NOTE — Patient Instructions (Signed)
Please follow-up w/ me in 3 weeks.  OK to transition out of post-op shoe if feeling good.   How to Buddy Tape Buddy taping refers to taping an injured finger or toe to an uninjured finger or toe that is next to it. This protects the injured finger or toe and keeps it from moving while the injury heals. You may buddy tape a finger or toe if you have a minor sprain. Your health care provider may buddy tape your finger or toe if you have a sprain, dislocation, or fracture. You may be told to replace your buddy taping as needed. What are the risks? Generally, buddy taping is safe. However, problems may occur, such as:  Skin injury or infection.  Reduced blood flow to the finger or toe.  Skin reaction to the tape. Do not buddy tape your toe if you have diabetes. Do not buddy tape if you know that you have an allergy to adhesives or surgical tape. Supplies needed:  Gauze pad, cotton, or cloth.  Tape. This may be called first-aid tape, surgical tape, or medical tape. How to buddy tape Before buddy taping Lessen any pain and swelling with rest, icing, and elevation:  Avoid activities that cause pain.  If directed, put ice on the injured area: ? Put ice in a plastic bag. ? Place a towel between your skin and the bag. ? Leave the ice on for 20 minutes, 2-3 times a day.  Raise (elevate) your hand or foot above the level of your heart while you are sitting or lying down.  Buddy taping procedure   Clean and dry your finger or toe as told by your health care provider.  Place a gauze pad or a piece of cloth or cotton between your injured finger or toe and the uninjured finger or toe.  Use tape to wrap around both fingers or toes so your injured finger or toe is secured to the uninjured finger or toe. ? The tape should be snug, but not tight. ? Make sure the ends of the piece of tape overlap. ? Avoid placing tape directly over the joint.  Change the tape and the padding as told by your  health care provider. Remove and replace the tape or padding if it becomes loose, worn, dirty, or wet. After buddy taping  Watch the buddy-taped area and always remove buddy taping if your: ? Pain gets worse. ? Fingers or toes turn pale or blue. ? Skin becomes irritated. Follow these instructions at home:  Take over-the-counter and prescription medicines only as told by your health care provider.  Return to your normal activities as told by your health care provider. Ask your health care provider what activities are safe for you. Contact a health care provider if:  You have pain, swelling, or bruising that lasts longer than 3 days.  You have a fever.  Your skin is red, cracked, or irritated. Get help right away if:  The injured area becomes cold, numb, or pale.  You have severe pain, swelling, bruising, or loss of movement in your finger or toe.  Your finger or toe changes shape (deformity). Summary  Buddy taping refers to taping an injured finger or toe to an uninjured finger or toe that is next to it.  You may buddy tape a finger or toe if you have a minor sprain.  Take over-the-counter and prescription medicines only as told by your health care provider. This information is not intended to replace advice given  to you by your health care provider. Make sure you discuss any questions you have with your health care provider. Document Released: 12/24/2004 Document Revised: 03/09/2019 Document Reviewed: 11/28/2018 Elsevier Patient Education  2020 [employer].

## 2019-10-11 NOTE — Progress Notes (Signed)
X-ray shows fracture at the fifth toe like we discussed.

## 2019-10-31 ENCOUNTER — Encounter: Payer: Self-pay | Admitting: Family Medicine

## 2019-10-31 ENCOUNTER — Other Ambulatory Visit: Payer: Self-pay

## 2019-10-31 ENCOUNTER — Ambulatory Visit (INDEPENDENT_AMBULATORY_CARE_PROVIDER_SITE_OTHER): Payer: No Typology Code available for payment source | Admitting: Family Medicine

## 2019-10-31 ENCOUNTER — Ambulatory Visit (INDEPENDENT_AMBULATORY_CARE_PROVIDER_SITE_OTHER): Payer: No Typology Code available for payment source

## 2019-10-31 VITALS — BP 102/68 | HR 87 | Ht 64.0 in | Wt 220.6 lb

## 2019-10-31 DIAGNOSIS — S92515D Nondisplaced fracture of proximal phalanx of left lesser toe(s), subsequent encounter for fracture with routine healing: Secondary | ICD-10-CM | POA: Diagnosis not present

## 2019-10-31 NOTE — Patient Instructions (Signed)
Thank you for coming in today. Get xray on the way now.  Continue the post op shoe as needed.  Ok to transition into a normal shoe as guided by pain.  Continue buddy tape.  Recheck in 1 month or sooner if needed.   We're moving!  Dr. Clovis Riley new office will be located at [address] on the 1st floor.  This location is across the street from the [employer] and in the same complex as the [facility] and [employer]  Our new office phone number will be [phone].  We anticipate beginning to see patients at the [facility] office in early December 2020.

## 2019-10-31 NOTE — Progress Notes (Signed)
I, Gina Moss, LAT, ATC, am serving as scribe for Dr. Clementeen Graham.  Gina Moss is a 28 y.o. female who presents to [facility] today for f/u of a L 5th toe fracture.  Pt was last seen on 10/10/19 and had a L foot XR which showed a fracture of her L 5th toe.  She was provided w/ a post-op shoe and instructed to buddy tape her L 5th toe to her L 4th.  Since her last visit, she reports 85% improvement in her symptoms.  She denies any bruising at this point.  She reports wearing her post-op shoe consistently.  She did try to put on "normal" shoes this weekend and had some pain and was able to transition back to postop shoe which controls her pain quite well.    ROS:  As above  Exam:  BP 102/68 (BP Location: Left Arm, Patient Position: Sitting, Cuff Size: Large)   Pulse 87   Ht 5\' 4"  (1.626 m)   Wt 220 lb 9.6 oz (100.1 kg)   LMP 10/02/2019   SpO2 98%   BMI 37.87 kg/m  Wt Readings from Last 5 Encounters:  10/31/19 220 lb 9.6 oz (100.1 kg)  10/10/19 220 lb 6.4 oz (100 kg)  08/29/19 217 lb (98.4 kg)  12/16/18 211 lb 4 oz (95.8 kg)   General: Well Developed, well nourished, and in no acute distress.  Neuro/Psych: Alert and oriented x3, extra-ocular muscles intact, able to move all 4 extremities, sensation grossly intact. Skin: Warm and dry, no rashes noted.  Respiratory: Not using accessory muscles, speaking in full sentences, trachea midline.  Cardiovascular: Pulses palpable, no extremity edema. Abdomen: Does not appear distended. MSK: Left foot normal-appearing not particularly tender.    Lab and Radiology Results X-ray images left fifth toe obtained today personally and independently reviewed Fracture line remains visible.  No change in angulation or displacement. Await formal radiology review     Assessment and Plan: 28 y.o. female with left fifth toe fracture.  Doing quite well.  Plan to continue postop shoe and buddy tape.  Recheck in 1 month.  Okay to  transition or wean out of postop shoe to regular shoe if comfortable.  Recheck sooner if needed.  Precautions reviewed.   PDMP not reviewed this encounter. Orders Placed This Encounter  Procedures  . XR Toe 5th Left    Order Specific Question:   Reason for exam:    Answer:   follow up 5th toe fx    Order Specific Question:   Is the patient pregnant?    Answer:   No    Order Specific Question:   Preferred imaging location?    Answer:   [facility]   No orders of the defined types were placed in this encounter.   Historical information moved to improve visibility of documentation.  Past Medical History:  Diagnosis Date  . History of chicken pox   . Hydradenitis     cysts drained in 2018 and 2019  . IBS (irritable bowel syndrome) 2018   Past Surgical History:  Procedure Laterality Date  . TONSILLECTOMY AND ADENOIDECTOMY  2007  . WISDOM TOOTH EXTRACTION Bilateral 2007   Social History   Tobacco Use  . Smoking status: Never Smoker  . Smokeless tobacco: Never Used  Substance Use Topics  . Alcohol use: Yes    Frequency: Never    Comment: socially   family history includes Alcohol abuse in her paternal grandfather; Depression in  her paternal grandmother; Hypercholesterolemia in her father.  Medications: Current Outpatient Medications  Medication Sig Dispense Refill  . Melatonin 5 MG CAPS Take 15 mg by mouth at bedtime.     . polyethylene glycol powder (GLYCOLAX/MIRALAX) powder Take by mouth.    . triamcinolone (KENALOG) 0.025 % ointment Apply 1 application topically 2 (two) times daily. Do not apply to face 15 g 1   No current facility-administered medications for this visit.    Allergies  Allergen Reactions  . Cephalexin Itching  . Hydrocodone Anaphylaxis and Shortness Of Breath  . Sulfa Antibiotics Hives  . Naproxen Other (See Comments)    Nose bleeds  . Aspirin Other (See Comments)    Nose bleeds  . Pollen Extract       Discussed warning signs  or symptoms. Please see discharge instructions. Patient expresses understanding.  The above documentation has been reviewed and is accurate and complete Lynne Leader

## 2019-11-01 NOTE — Progress Notes (Signed)
X-ray toe shows stable appearing fracture.  This should heal nicely.

## 2019-11-15 ENCOUNTER — Other Ambulatory Visit: Payer: No Typology Code available for payment source

## 2019-11-29 ENCOUNTER — Ambulatory Visit (INDEPENDENT_AMBULATORY_CARE_PROVIDER_SITE_OTHER): Payer: No Typology Code available for payment source | Admitting: Family Medicine

## 2019-11-29 ENCOUNTER — Encounter: Payer: Self-pay | Admitting: Family Medicine

## 2019-11-29 ENCOUNTER — Other Ambulatory Visit: Payer: Self-pay

## 2019-11-29 VITALS — BP 110/76 | HR 91 | Ht 64.0 in | Wt 222.8 lb

## 2019-11-29 DIAGNOSIS — S92515D Nondisplaced fracture of proximal phalanx of left lesser toe(s), subsequent encounter for fracture with routine healing: Secondary | ICD-10-CM | POA: Diagnosis not present

## 2019-11-29 NOTE — Patient Instructions (Signed)
Thank you for coming in today. Ok to continue normal activity.  Recheck with me as needed if not better.

## 2019-11-29 NOTE — Progress Notes (Signed)
I, Wendy Poet, LAT, ATC, am serving as scribe for Dr. Lynne Leader.  Gina Moss is a 28 y.o. female who presents to [facility] today for f/u of L 5th toe fracture.  Pt was last seen by Dr. Georgina Snell on 10/31/19 and had a f/u XR of her L 5th toe.  She was advised to transition out of the post-op shoe and into a regular shoe as her symptoms allow.  Since her last visit, pt reports that her L 5th toe is feeling well w/ no pain noted.  She transitioned to regular shoes approximately 1.5 weeks after her last visit w/ no issue.    ROS:  As above  Exam:  BP 110/76 (BP Location: Left Arm, Patient Position: Sitting, Cuff Size: Large)   Pulse 91   Ht 5\' 4"  (1.626 m)   Wt 222 lb 12.8 oz (101.1 kg)   SpO2 98%   BMI 38.24 kg/m  Wt Readings from Last 5 Encounters:  11/29/19 222 lb 12.8 oz (101.1 kg)  10/31/19 220 lb 9.6 oz (100.1 kg)  10/10/19 220 lb 6.4 oz (100 kg)  08/29/19 217 lb (98.4 kg)  12/16/18 211 lb 4 oz (95.8 kg)   General: Well Developed, well nourished, and in no acute distress.  Neuro/Psych: Alert and oriented x3, extra-ocular muscles intact, able to move all 4 extremities, sensation grossly intact. Skin: Warm and dry, no rashes noted.  Respiratory: Not using accessory muscles, speaking in full sentences, trachea midline.  Cardiovascular: Pulses palpable, no extremity edema. Abdomen: Does not appear distended. MSK: Left foot and fifth toe normal-appearing nontender normal motion.  Pulses cap refill and sensation are intact distally.    Lab and Radiology Results X-ray images left fifth toe obtained November 10 and December 1 independently and personally reviewed    Assessment and Plan: 28 y.o. female with  Left fifth toe fracture.  Status post about 8 weeks now at this point.  Patient has had significant clinical improvement and is currently asymptomatic.  Her physical exam is normal today.  Discussed the possibility of getting an x-ray.   Both the patient and myself think it is reasonable to proceed with no x-ray at this point given her clinical improvement.  Watchful waiting recheck back with me as needed for this or future issues.  Resume normal activity.    Historical information moved to improve visibility of documentation.  Past Medical History:  Diagnosis Date  . History of chicken pox   . Hydradenitis     cysts drained in 2018 and 2019  . IBS (irritable bowel syndrome) 2018   Past Surgical History:  Procedure Laterality Date  . TONSILLECTOMY AND ADENOIDECTOMY  2007  . WISDOM TOOTH EXTRACTION Bilateral 2007   Social History   Tobacco Use  . Smoking status: Never Smoker  . Smokeless tobacco: Never Used  Substance Use Topics  . Alcohol use: Yes    Comment: socially   family history includes Alcohol abuse in her paternal grandfather; Depression in her paternal grandmother; Hypercholesterolemia in her father.  Medications: Current Outpatient Medications  Medication Sig Dispense Refill  . Melatonin 5 MG CAPS Take 15 mg by mouth at bedtime.     . polyethylene glycol powder (GLYCOLAX/MIRALAX) powder Take by mouth.     No current facility-administered medications for this visit.   Allergies  Allergen Reactions  . Cephalexin Itching  . Hydrocodone Anaphylaxis and Shortness Of Breath  . Sulfa Antibiotics Hives  . Naproxen Other (  See Comments)    Nose bleeds  . Aspirin Other (See Comments)    Nose bleeds  . Pollen Extract       Discussed warning signs or symptoms. Please see discharge instructions. Patient expresses understanding.  The above documentation has been reviewed and is accurate and complete Clementeen Graham

## 2020-01-03 ENCOUNTER — Other Ambulatory Visit: Payer: Self-pay

## 2020-01-04 ENCOUNTER — Ambulatory Visit (INDEPENDENT_AMBULATORY_CARE_PROVIDER_SITE_OTHER): Payer: No Typology Code available for payment source | Admitting: Physician Assistant

## 2020-01-04 ENCOUNTER — Encounter: Payer: Self-pay | Admitting: Physician Assistant

## 2020-01-04 VITALS — BP 126/80 | HR 86 | Temp 97.7°F | Ht 68.0 in | Wt 220.2 lb

## 2020-01-04 DIAGNOSIS — F419 Anxiety disorder, unspecified: Secondary | ICD-10-CM | POA: Diagnosis not present

## 2020-01-04 DIAGNOSIS — Z136 Encounter for screening for cardiovascular disorders: Secondary | ICD-10-CM

## 2020-01-04 DIAGNOSIS — Z0000 Encounter for general adult medical examination without abnormal findings: Secondary | ICD-10-CM | POA: Diagnosis not present

## 2020-01-04 DIAGNOSIS — E669 Obesity, unspecified: Secondary | ICD-10-CM

## 2020-01-04 DIAGNOSIS — Z789 Other specified health status: Secondary | ICD-10-CM | POA: Diagnosis not present

## 2020-01-04 DIAGNOSIS — Z13220 Encounter for screening for lipoid disorders: Secondary | ICD-10-CM

## 2020-01-04 LAB — CBC WITH DIFFERENTIAL/PLATELET
Basophils Absolute: 0.1 10*3/uL (ref 0.0–0.1)
Basophils Relative: 0.8 % (ref 0.0–3.0)
Eosinophils Absolute: 0.1 10*3/uL (ref 0.0–0.7)
Eosinophils Relative: 1.8 % (ref 0.0–5.0)
HCT: 39.7 % (ref 36.0–46.0)
Hemoglobin: 12.9 g/dL (ref 12.0–15.0)
Lymphocytes Relative: 23 % (ref 12.0–46.0)
Lymphs Abs: 1.8 10*3/uL (ref 0.7–4.0)
MCHC: 32.6 g/dL (ref 30.0–36.0)
MCV: 90.4 fl (ref 78.0–100.0)
Monocytes Absolute: 0.6 10*3/uL (ref 0.1–1.0)
Monocytes Relative: 7.5 % (ref 3.0–12.0)
Neutro Abs: 5.3 10*3/uL (ref 1.4–7.7)
Neutrophils Relative %: 66.9 % (ref 43.0–77.0)
Platelets: 359 10*3/uL (ref 150.0–400.0)
RBC: 4.39 Mil/uL (ref 3.87–5.11)
RDW: 14.2 % (ref 11.5–15.5)
WBC: 7.9 10*3/uL (ref 4.0–10.5)

## 2020-01-04 LAB — COMPREHENSIVE METABOLIC PANEL
ALT: 9 U/L (ref 0–35)
AST: 13 U/L (ref 0–37)
Albumin: 4.4 g/dL (ref 3.5–5.2)
Alkaline Phosphatase: 88 U/L (ref 39–117)
BUN: 9 mg/dL (ref 6–23)
CO2: 25 mEq/L (ref 19–32)
Calcium: 9.3 mg/dL (ref 8.4–10.5)
Chloride: 105 mEq/L (ref 96–112)
Creatinine, Ser: 0.78 mg/dL (ref 0.40–1.20)
GFR: 86.27 mL/min (ref >60.00)
Glucose, Bld: 88 mg/dL (ref 70–99)
Potassium: 4.3 mEq/L (ref 3.5–5.1)
Sodium: 138 mEq/L (ref 135–145)
Total Bilirubin: 1.1 mg/dL (ref 0.2–1.2)
Total Protein: 7.1 g/dL (ref 6.0–8.3)

## 2020-01-04 LAB — LIPID PANEL
Cholesterol: 178 mg/dL (ref 0–200)
HDL: 52.4 mg/dL (ref >39.00)
LDL Cholesterol: 100 mg/dL — ABNORMAL HIGH (ref 0–99)
NonHDL: 126.01
Total CHOL/HDL Ratio: 3
Triglycerides: 130 mg/dL (ref 0.0–149.0)
VLDL: 26 mg/dL (ref 0.0–40.0)

## 2020-01-04 LAB — HEMOGLOBIN A1C: Hgb A1c MFr Bld: 5.3 % (ref 4.6–6.5)

## 2020-01-04 LAB — VITAMIN B12: Vitamin B-12: 146 pg/mL — ABNORMAL LOW (ref 211–911)

## 2020-01-04 NOTE — Progress Notes (Signed)
I acted as a Neurosurgeon for [employer], PA-C Corky Mull, LPN  Subjective:    Gina Moss is a 29 y.o. female and is here for a comprehensive physical exam.  HPI  There are no preventive care reminders to display for this patient.  Acute Concerns: None  Chronic Issues: Obesity -- trying to maintain 1700-1800 calories daily; has had gained 10 lb over the past year. Has difficulty taking stairs 2/2 knee pain. Can walk 1-2 miles without difficulty, typically a leisurely walk. Anxiety -- very mild; has worsened over the past year 2/2 pandemic and increased job stress. Has taken prozac and wellbutrin in the past with good results.  Health Maintenance: Immunizations -- UTD Colonoscopy -- N/A Mammogram -- N/A PAP -- UTD Bone Density -- N/A Diet -- vegan; comfort eater at times; doesn't allow herself to eat after dinner; carbs and savory cravings at times Caffeine intake -- 1 cup coffee daily Sleep habits -- good sleeper with melatonin Exercise -- walks the dog Current Weight -- Weight: 220 lb 4 oz (99.9 kg)  Weight History: Wt Readings from Last 10 Encounters:  01/04/20 220 lb 4 oz (99.9 kg)  11/29/19 222 lb 12.8 oz (101.1 kg)  10/31/19 220 lb 9.6 oz (100.1 kg)  10/10/19 220 lb 6.4 oz (100 kg)  08/29/19 217 lb (98.4 kg)  12/16/18 211 lb 4 oz (95.8 kg)   Mood -- anxiety at times Patient's last menstrual period was 12/21/2019.   Depression screen PHQ 2/9 01/04/2020  Decreased Interest 0  Down, Depressed, Hopeless 0  PHQ - 2 Score 0   Other providers/specialists: Patient Care Team: Jarold Motto, [place] as PCP - General (Physician Assistant) Sedalia Muta, PT as Physical Therapist (Physical Therapy)   PMHx, SurgHx, SocialHx, Medications, and Allergies were reviewed in the Visit Navigator and updated as appropriate.   Past Medical History:  Diagnosis Date  . History of chicken pox   . Hydradenitis     cysts drained in 2018 and 2019  . IBS (irritable bowel  syndrome) 2018     Past Surgical History:  Procedure Laterality Date  . TONSILLECTOMY AND ADENOIDECTOMY  2007  . WISDOM TOOTH EXTRACTION Bilateral 2007     Family History  Problem Relation Age of Onset  . Hypercholesterolemia Father   . Depression Paternal Grandmother   . Alcohol abuse Paternal Grandfather   . Leukemia Maternal Grandmother   . Hodgkin's lymphoma Maternal Uncle     Social History   Tobacco Use  . Smoking status: Never Smoker  . Smokeless tobacco: Never Used  Substance Use Topics  . Alcohol use: Yes    Comment: socially  . Drug use: Never    Review of Systems:   Review of Systems  Constitutional: Negative for chills, fever, malaise/fatigue and weight loss.  HENT: Negative for hearing loss, sinus pain and sore throat.   Respiratory: Negative for cough and hemoptysis.   Cardiovascular: Negative for chest pain, palpitations, leg swelling and PND.  Gastrointestinal: Negative for abdominal pain, constipation, diarrhea, heartburn, nausea and vomiting.  Genitourinary: Negative for dysuria, frequency and urgency.  Musculoskeletal: Negative for back pain, myalgias and neck pain.  Skin: Negative for itching and rash.  Neurological: Negative for dizziness, tingling, seizures and headaches.  Endo/Heme/Allergies: Negative for polydipsia.  Psychiatric/Behavioral: Negative for depression. The patient is not nervous/anxious.     Objective:   BP 126/80 (BP Location: Left Arm, Patient Position: Sitting, Cuff Size: Large)   Pulse 86   Temp 97.7 F (  36.5 C) (Temporal)   Ht 5\' 4"  (1.626 m)   Wt 220 lb 4 oz (99.9 kg)   LMP 12/21/2019   SpO2 97%   BMI 37.81 kg/m   General Appearance:    Alert, cooperative, no distress, appears stated age  Head:    Normocephalic, without obvious abnormality, atraumatic  Eyes:    PERRL, conjunctiva/corneas clear, EOM's intact, fundi    benign, both eyes  Ears:    Normal TM's and external ear canals, both ears  Nose:   Nares  normal, septum midline, mucosa normal, no drainage    or sinus tenderness  Throat:   Lips, mucosa, and tongue normal; teeth and gums normal  Neck:   Supple, symmetrical, trachea midline, no adenopathy;    thyroid:  no enlargement/tenderness/nodules; no carotid   bruit or JVD  Back:     Symmetric, no curvature, ROM normal, no CVA tenderness  Lungs:     Clear to auscultation bilaterally, respirations unlabored  Chest Wall:    No tenderness or deformity   Heart:    Regular rate and rhythm, S1 and S2 normal, no murmur, rub   or gallop  Breast Exam:    Deferred  Abdomen:     Soft, non-tender, bowel sounds active all four quadrants,    no masses, no organomegaly  Genitalia:    Deferred  Rectal:    Deferred  Extremities:   Extremities normal, atraumatic, no cyanosis or edema  Pulses:   2+ and symmetric all extremities  Skin:   Skin color, texture, turgor normal, no rashes or lesions  Lymph nodes:   Cervical, supraclavicular, and axillary nodes normal  Neurologic:   CNII-XII intact, normal strength, sensation and reflexes    throughout   Assessment/Plan:   Gina Moss was seen today for annual exam.  Diagnoses and all orders for this visit:  Routine physical examination Today patient counseled on age appropriate routine health concerns for screening and prevention, each reviewed and up to date or declined. Immunizations reviewed and up to date or declined. Labs ordered and reviewed. Risk factors for depression reviewed and negative. Hearing function and visual acuity are intact. ADLs screened and addressed as needed. Functional ability and level of safety reviewed and appropriate. Education, counseling and referrals performed based on assessed risks today. Patient provided with a copy of personalized plan for preventive services. -     CBC with Differential/Platelet -     Comprehensive metabolic panel  Obesity, unspecified classification, unspecified obesity type, unspecified whether serious  comorbidity present Interested in metformin or possibly prozac (has caused decreased appetite/weight loss in the past). Will update labs and determine best medication for her at that time. Work on continued calorie reduction and physical activity as able. She is not interested in Claris Gower.  Vegan Will update Vitamin B12 level and provide recommendations on supplementation if appropriate. -     Vitamin B12  Anxiety Mild; denies SI/HI. Will consider adding prozac, see above. Worsening precautions and mood changes advised.  Encounter for lipid screening for cardiovascular disease -     Lipid panel    Well Adult Exam: Labs ordered: Yes. Patient counseling was done. See below for items discussed. Discussed the patient's BMI. The BMI is not in the acceptable range; BMI management plan is completed Follow up as needed for acute illness.  Patient Counseling:   [x]     Nutrition: Stressed importance of moderation in sodium/caffeine intake, saturated fat and cholesterol, caloric balance, sufficient intake of fresh fruits, vegetables,  fiber, calcium, iron, and 1 mg of folate supplement per day (for females capable of pregnancy).   [x]      Stressed the importance of regular exercise.    [x]     Substance Abuse: Discussed cessation/primary prevention of tobacco, alcohol, or other drug use; driving or other dangerous activities under the influence; availability of treatment for abuse.    [x]      Injury prevention: Discussed safety belts, safety helmets, smoke detector, smoking near bedding or upholstery.    [x]      Sexuality: Discussed sexually transmitted diseases, partner selection, use of condoms, avoidance of unintended pregnancy  and contraceptive alternatives.    [x]     Dental health: Discussed importance of regular tooth brushing, flossing, and dental visits.   [x]      Health maintenance and immunizations reviewed. Please refer to Health maintenance section.   CMA or LPN served as  scribe during this visit. History, Physical, and Plan performed by medical provider. The above documentation has been reviewed and is accurate and complete.   Inda Coke, PA-C [place]

## 2020-01-04 NOTE — Patient Instructions (Signed)
It was great to see you!  Please go to the lab for blood work.   Our office will call you with your results unless you have chosen to receive results via MyChart.  If your blood work is normal we will follow-up each year for physicals and as scheduled for chronic medical problems.  If anything is abnormal we will treat accordingly and get you in for a follow-up.  Take care,  Gina Moss    Health Maintenance, Female Adopting a healthy lifestyle and getting preventive care are important in promoting health and wellness. Ask your health care provider about:  The right schedule for you to have regular tests and exams.  Things you can do on your own to prevent diseases and keep yourself healthy. What should I know about diet, weight, and exercise? Eat a healthy diet   Eat a diet that includes plenty of vegetables, fruits, low-fat dairy products, and lean protein.  Do not eat a lot of foods that are high in solid fats, added sugars, or sodium. Maintain a healthy weight Body mass index (BMI) is used to identify weight problems. It estimates body fat based on height and weight. Your health care provider can help determine your BMI and help you achieve or maintain a healthy weight. Get regular exercise Get regular exercise. This is one of the most important things you can do for your health. Most adults should:  Exercise for at least 150 minutes each week. The exercise should increase your heart rate and make you sweat (moderate-intensity exercise).  Do strengthening exercises at least twice a week. This is in addition to the moderate-intensity exercise.  Spend less time sitting. Even light physical activity can be beneficial. Watch cholesterol and blood lipids Have your blood tested for lipids and cholesterol at 31 years of age, then have this test every 5 years. Have your cholesterol levels checked more often if:  Your lipid or cholesterol levels are high.  You are older than 31  years of age.  You are at high risk for heart disease. What should I know about cancer screening? Depending on your health history and family history, you may need to have cancer screening at various ages. This may include screening for:  Breast cancer.  Cervical cancer.  Colorectal cancer.  Skin cancer.  Lung cancer. What should I know about heart disease, diabetes, and high blood pressure? Blood pressure and heart disease  High blood pressure causes heart disease and increases the risk of stroke. This is more likely to develop in people who have high blood pressure readings, are of African descent, or are overweight.  Have your blood pressure checked: ? Every 3-5 years if you are 18-39 years of age. ? Every year if you are 40 years old or older. Diabetes Have regular diabetes screenings. This checks your fasting blood sugar level. Have the screening done:  Once every three years after age 40 if you are at a normal weight and have a low risk for diabetes.  More often and at a younger age if you are overweight or have a high risk for diabetes. What should I know about preventing infection? Hepatitis B If you have a higher risk for hepatitis B, you should be screened for this virus. Talk with your health care provider to find out if you are at risk for hepatitis B infection. Hepatitis C Testing is recommended for:  Everyone born from 1945 through 1965.  Anyone with known risk factors for hepatitis C. Sexually   transmitted infections (STIs)  Get screened for STIs, including gonorrhea and chlamydia, if: ? You are sexually active and are younger than 31 years of age. ? You are older than 31 years of age and your health care provider tells you that you are at risk for this type of infection. ? Your sexual activity has changed since you were last screened, and you are at increased risk for chlamydia or gonorrhea. Ask your health care provider if you are at risk.  Ask your health  care provider about whether you are at high risk for HIV. Your health care provider may recommend a prescription medicine to help prevent HIV infection. If you choose to take medicine to prevent HIV, you should first get tested for HIV. You should then be tested every 3 months for as long as you are taking the medicine. Pregnancy  If you are about to stop having your period (premenopausal) and you may become pregnant, seek counseling before you get pregnant.  Take 400 to 800 micrograms (mcg) of folic acid every day if you become pregnant.  Ask for birth control (contraception) if you want to prevent pregnancy. Osteoporosis and menopause Osteoporosis is a disease in which the bones lose minerals and strength with aging. This can result in bone fractures. If you are 65 years old or older, or if you are at risk for osteoporosis and fractures, ask your health care provider if you should:  Be screened for bone loss.  Take a calcium or vitamin D supplement to lower your risk of fractures.  Be given hormone replacement therapy (HRT) to treat symptoms of menopause. Follow these instructions at home: Lifestyle  Do not use any products that contain nicotine or tobacco, such as cigarettes, e-cigarettes, and chewing tobacco. If you need help quitting, ask your health care provider.  Do not use street drugs.  Do not share needles.  Ask your health care provider for help if you need support or information about quitting drugs. Alcohol use  Do not drink alcohol if: ? Your health care provider tells you not to drink. ? You are pregnant, may be pregnant, or are planning to become pregnant.  If you drink alcohol: ? Limit how much you use to 0-1 drink a day. ? Limit intake if you are breastfeeding.  Be aware of how much alcohol is in your drink. In the [place] one drink equals one 12 oz bottle of beer (355 mL), one 5 oz glass of wine (148 mL), or one 1 oz glass of hard liquor (44 mL). General  instructions  Schedule regular health, dental, and eye exams.  Stay current with your vaccines.  Tell your health care provider if: ? You often feel depressed. ? You have ever been abused or do not feel safe at home. Summary  Adopting a healthy lifestyle and getting preventive care are important in promoting health and wellness.  Follow your health care provider's instructions about healthy diet, exercising, and getting tested or screened for diseases.  Follow your health care provider's instructions on monitoring your cholesterol and blood pressure. This information is not intended to replace advice given to you by your health care provider. Make sure you discuss any questions you have with your health care provider. Document Revised: 11/09/2018 Document Reviewed: 11/09/2018 Elsevier Patient Education  2020 Elsevier Inc.  

## 2020-01-05 ENCOUNTER — Other Ambulatory Visit: Payer: Self-pay | Admitting: Physician Assistant

## 2020-01-05 MED ORDER — FLUOXETINE HCL 10 MG PO CAPS: 10.0000 mg | ORAL_CAPSULE | Freq: Every day | ORAL | 1 refills | Status: DC

## 2020-01-05 MED FILL — FLUoxetine HCL 10 MG CAPS: 10 | 90 days supply | Qty: 90 | Fill #0

## 2020-03-19 ENCOUNTER — Encounter: Payer: Self-pay | Admitting: Physician Assistant

## 2020-03-19 ENCOUNTER — Other Ambulatory Visit: Payer: Self-pay

## 2020-03-19 ENCOUNTER — Ambulatory Visit (INDEPENDENT_AMBULATORY_CARE_PROVIDER_SITE_OTHER): Payer: No Typology Code available for payment source | Admitting: Physician Assistant

## 2020-03-19 VITALS — BP 110/78 | HR 97 | Temp 96.3°F | Ht 68.0 in | Wt 217.6 lb

## 2020-03-19 DIAGNOSIS — F419 Anxiety disorder, unspecified: Secondary | ICD-10-CM | POA: Diagnosis not present

## 2020-03-19 DIAGNOSIS — E538 Deficiency of other specified B group vitamins: Secondary | ICD-10-CM | POA: Diagnosis not present

## 2020-03-19 MED ORDER — FLUOXETINE HCL 10 MG PO CAPS: 10.0000 mg | ORAL_CAPSULE | Freq: Every day | ORAL | 1 refills | Status: DC

## 2020-03-19 NOTE — Progress Notes (Signed)
Gina Moss is a 29 y.o. female is here to follow up on anxiety.  I acted as a Neurosurgeon for [employer], PA-C [employer], [place]  History of Present Illness:   Chief Complaint  Patient presents with  . Anxiety    HPI   Anxiety Started Prozac 10 mg since last visit. She is tolerating this well and feels like it is a good medication addition for her. Denies any concerning side effects from this medication. She states that she is sleeping well. Denies SI/HI.  GAD 7 : Generalized Anxiety Score 03/19/2020  Nervous, Anxious, on Edge 0  Control/stop worrying 0  Worry too much - different things 0  Trouble relaxing 0  Restless 0  Easily annoyed or irritable 0  Afraid - awful might happen 0  Total GAD 7 Score 0  Anxiety Difficulty Not difficult at all   B12 deficiency  Last B12 lab was 146 about 2 months ago. She is currently taking 1000 mcg B12 daily. Tolerating well. Denies any concerning side effects.   There are no preventive care reminders to display for this patient.  Past Medical History:  Diagnosis Date  . History of chicken pox   . Hydradenitis     cysts drained in 2018 and 2019  . IBS (irritable bowel syndrome) 2018     Social History   Socioeconomic History  . Marital status: Single    Spouse name: Not on file  . Number of children: Not on file  . Years of education: Not on file  . Highest education level: Not on file  Occupational History  . Not on file  Tobacco Use  . Smoking status: Never Smoker  . Smokeless tobacco: Never Used  Substance and Sexual Activity  . Alcohol use: Yes    Comment: socially  . Drug use: Never  . Sexual activity: Yes    Partners: Male    Birth control/protection: Other-see comments    Comment: Vasectomy  Other Topics Concern  . Not on file  Social History Narrative   Engaged, getting married Oct 2020   Works at [employer] as [employer]   Social Determinants of Corporate investment banker Strain:   . Difficulty of Paying  Living Expenses:   Food Insecurity:   . Worried About Programme researcher, broadcasting/film/video in the Last Year:   . Barista in the Last Year:   Transportation Needs:   . Freight forwarder (Medical):   Marland Kitchen Lack of Transportation (Non-Medical):   Physical Activity:   . Days of Exercise per Week:   . Minutes of Exercise per Session:   Stress:   . Feeling of Stress :   Social Connections:   . Frequency of Communication with Friends and Family:   . Frequency of Social Gatherings with Friends and Family:   . Attends Religious Services:   . Active Member of Clubs or Organizations:   . Attends Banker Meetings:   Marland Kitchen Marital Status:   Intimate Partner Violence:   . Fear of Current or Ex-Partner:   . Emotionally Abused:   Marland Kitchen Physically Abused:   . Sexually Abused:     Past Surgical History:  Procedure Laterality Date  . TONSILLECTOMY AND ADENOIDECTOMY  2007  . WISDOM TOOTH EXTRACTION Bilateral 2007    Family History  Problem Relation Age of Onset  . Hypercholesterolemia Father   . Depression Paternal Grandmother   . Alcohol abuse Paternal Grandfather   . Leukemia Maternal  Grandmother   . Hodgkin's lymphoma Maternal Uncle     PMHx, SurgHx, SocialHx, FamHx, Medications, and Allergies were reviewed in the Visit Navigator and updated as appropriate.   Patient Active Problem List   Diagnosis Date Noted  . IBS (irritable bowel syndrome) 08/29/2019  . Hydradenitis 08/29/2019    Social History   Tobacco Use  . Smoking status: Never Smoker  . Smokeless tobacco: Never Used  Substance Use Topics  . Alcohol use: Yes    Comment: socially  . Drug use: Never    Current Medications and Allergies:    Current Outpatient Medications:  .  Cyanocobalamin (B-12) 1000 MCG TABS, Take 1,000 mcg by mouth daily., Disp: , Rfl:  .  FLUoxetine (PROZAC) 10 MG capsule, Take 1 capsule (10 mg total) by mouth daily., Disp: 90 capsule, Rfl: 1 .  Melatonin 5 MG CAPS, Take 15 mg by mouth at  bedtime. , Disp: , Rfl:  .  polyethylene glycol powder (GLYCOLAX/MIRALAX) powder, Take 1 Container by mouth as needed. , Disp: , Rfl:    Allergies  Allergen Reactions  . Cephalexin Itching  . Hydrocodone Anaphylaxis and Shortness Of Breath  . Sulfa Antibiotics Hives  . Naproxen Other (See Comments)    Nose bleeds  . Aspirin Other (See Comments)    Nose bleeds  . Pollen Extract     Review of Systems   ROS  Negative unless otherwise specified per HPI.  Vitals:   Vitals:   03/19/20 0759  BP: 110/78  Pulse: 97  Temp: (!) 96.3 F (35.7 C)  TempSrc: Temporal  SpO2: 98%  Weight: 217 lb 9.6 oz (98.7 kg)  Height: 5\' 8"  (1.727 m)     Body mass index is 33.09 kg/m.   Physical Exam:    Physical Exam Vitals and nursing note reviewed.  Constitutional:      General: She is not in acute distress.    Appearance: She is well-developed. She is not ill-appearing or toxic-appearing.  Cardiovascular:     Rate and Rhythm: Normal rate and regular rhythm.     Pulses: Normal pulses.     Heart sounds: Normal heart sounds, S1 normal and S2 normal.     Comments: No LE edema Pulmonary:     Effort: Pulmonary effort is normal.     Breath sounds: Normal breath sounds.  Skin:    General: Skin is warm and dry.  Neurological:     Mental Status: She is alert.     GCS: GCS eye subscore is 4. GCS verbal subscore is 5. GCS motor subscore is 6.  Psychiatric:        Speech: Speech normal.        Behavior: Behavior normal. Behavior is cooperative.      Assessment and Plan:    Gina Moss was seen today for anxiety.  Diagnoses and all orders for this visit:  Anxiety Tolerating Prozac 10 mg well.  Continue current regimen and follow-up in 6 months, sooner if concerns. -     FLUoxetine (PROZAC) 10 MG capsule; Take 1 capsule (10 mg total) by mouth daily.  B12 deficiency Continue over-the-counter B12 supplementation.  I did offer a vitamin B12 injection today however she declined.  I  advised her to follow-up in 2 months or so for repeat B12 lab so we can determine further supplementation. -     Vitamin B12; Future  . Reviewed expectations re: course of current medical issues. . Discussed self-management of symptoms. . Outlined signs and  symptoms indicating need for more acute intervention. . Patient verbalized understanding and all questions were answered. . See orders for this visit as documented in the electronic medical record. . Patient received an After Visit Summary.  CMA or LPN served as scribe during this visit. History, Physical, and Plan performed by medical provider. The above documentation has been reviewed and is accurate and complete.   Inda Coke, PA-C [place] [place] 03/19/2020  Follow-up: No follow-ups on file.

## 2020-03-19 NOTE — Patient Instructions (Signed)
It was great to see you!  Continue Prozac 10 mg daily.  Please follow-up in 2 months for a LAB only appointment for your B12 level.  Let's follow-up in 6 months regarding your anxiety, sooner if you have concerns.  Due to recent changes in healthcare laws, you may see the results of your imaging and laboratory studies on MyChart before your provider has had a chance to review them.  We understand that in some cases there may be results that are confusing or concerning to you. Not all laboratory results come back in the same time frame and the provider may be waiting for multiple results in order to interpret others.  Please give [place] 48 hours in order for your provider to thoroughly review all the results before contacting the office for clarification of your results.   Take care,  Jarold Motto PA-C

## 2020-04-03 MED FILL — FLUoxetine HCL 10 MG CAPS: 10 | 90 days supply | Qty: 90 | Fill #0

## 2020-06-15 IMAGING — DX DG FOOT COMPLETE 3+V*L*
3 series · 3 of 3 positions shown · non-contrast
Comparison: None.

CLINICAL DATA: Pain in the fifth toe. Evaluate for fracture.

EXAM:
LEFT FOOT - COMPLETE 3+ VIEW

[foot dp]
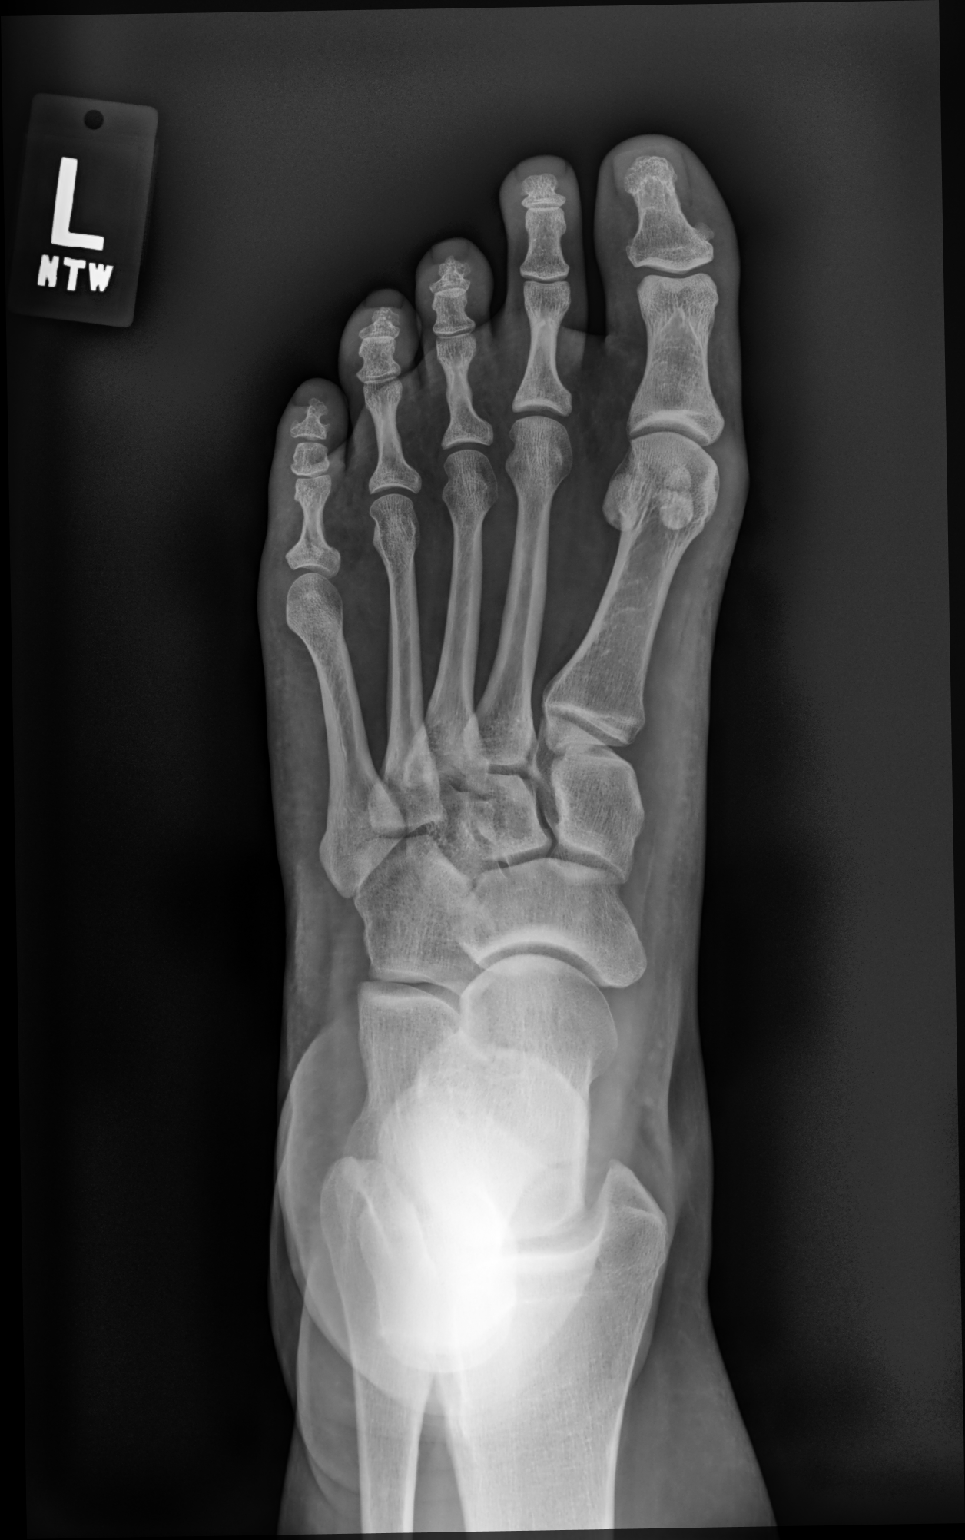

[foot oblique]
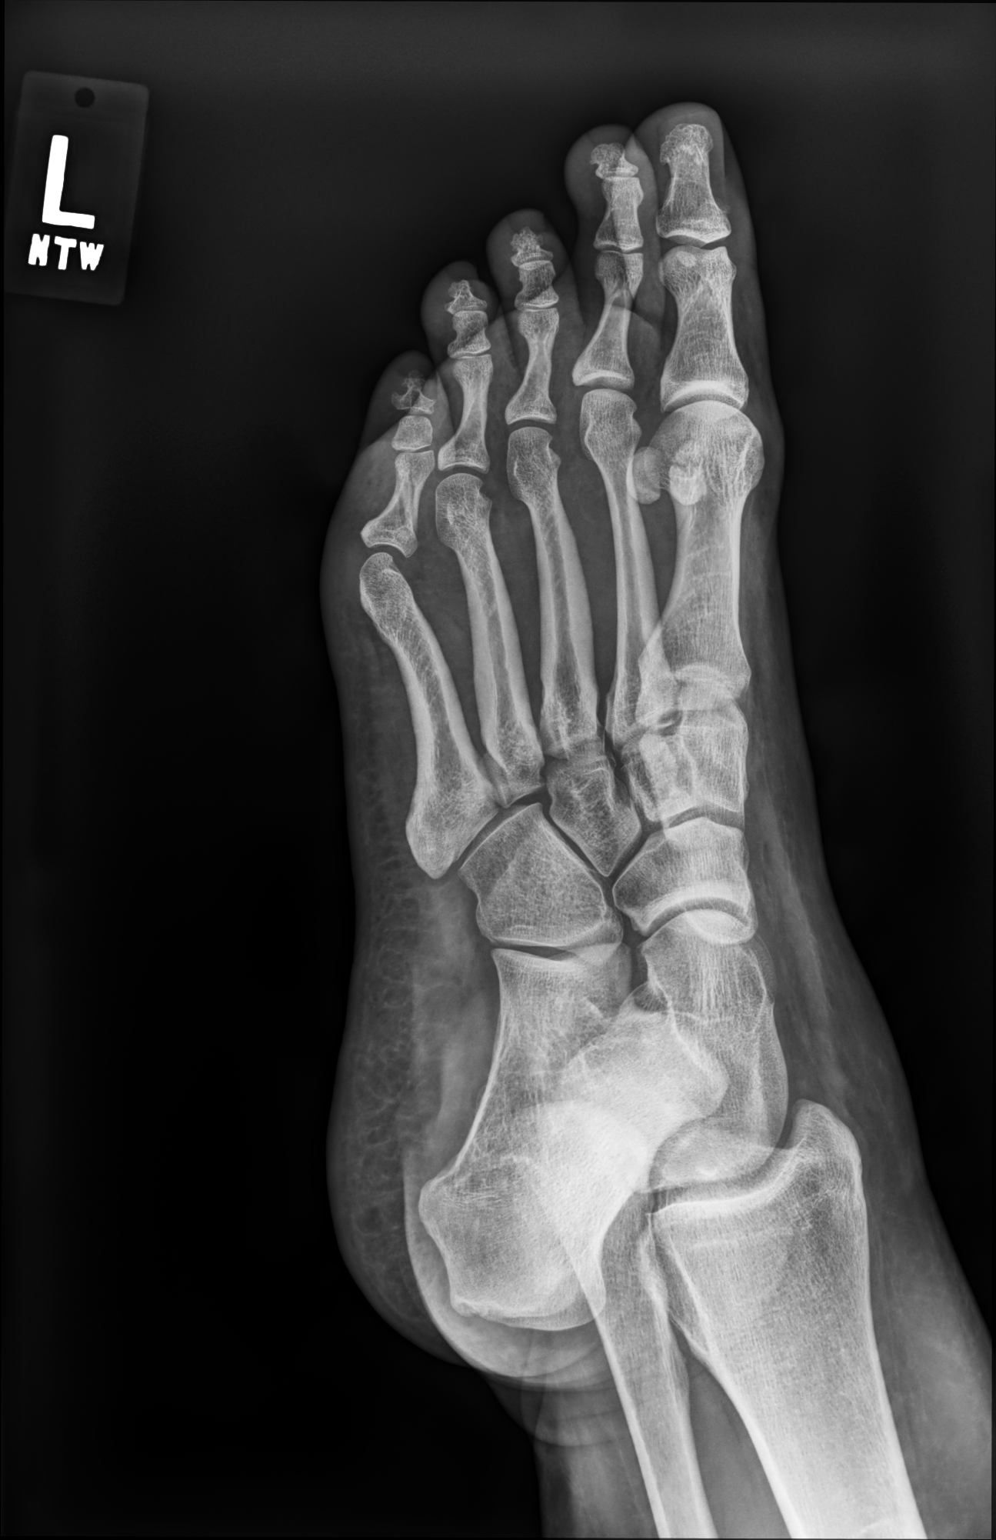

[foot lat]
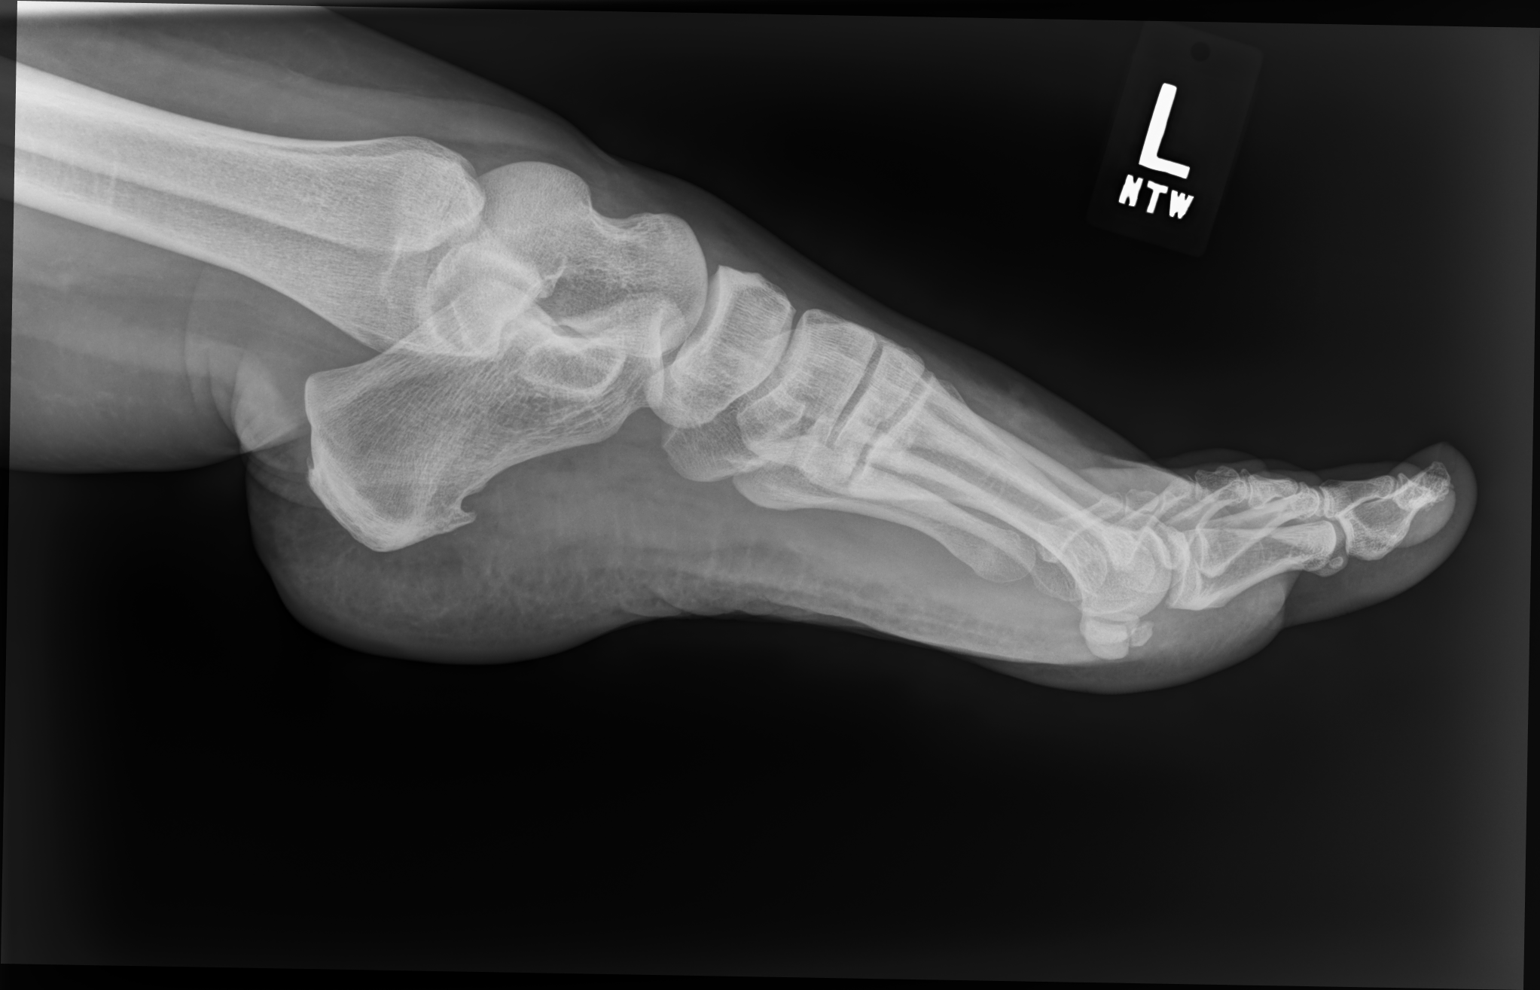

[3 of 3 positions shown; findings below may reference images not displayed]

FINDINGS: There is an acute nondisplaced intra-articular fracture involving
the proximal phalanx of the fifth digit. There is no dislocation.
IMPRESSION: Acute nondisplaced intra-articular fracture involving the proximal
phalanx of the fifth digit. No dislocation.

## 2020-06-24 MED FILL — FLUoxetine HCL 10 MG CAPS: 10 | 90 days supply | Qty: 90 | Fill #1

## 2020-09-30 ENCOUNTER — Telehealth (INDEPENDENT_AMBULATORY_CARE_PROVIDER_SITE_OTHER): Payer: Self-pay | Admitting: Physician Assistant

## 2020-09-30 ENCOUNTER — Other Ambulatory Visit: Payer: Self-pay | Admitting: Physician Assistant

## 2020-09-30 ENCOUNTER — Encounter: Payer: Self-pay | Admitting: Physician Assistant

## 2020-09-30 DIAGNOSIS — F419 Anxiety disorder, unspecified: Secondary | ICD-10-CM

## 2020-09-30 MED ORDER — FLUOXETINE HCL 10 MG PO CAPS: 10.0000 mg | ORAL_CAPSULE | Freq: Every day | ORAL | 3 refills | Status: DC

## 2020-09-30 MED FILL — FLUoxetine HCL 10 MG CAPS: 10 | 30 days supply | Qty: 30 | Fill #0

## 2020-09-30 NOTE — Progress Notes (Signed)
Virtual Visit via Video   I connected with Gina Moss on 09/30/20 at 12:00 PM EDT by a video enabled telemedicine application and verified that I am speaking with the correct person using two identifiers. Location patient: Home Location provider: [facility], Office Persons participating in the virtual visit: Norena Bratton, Jarold Motto PA-C, Corky Mull, LPN   I discussed the limitations of evaluation and management by telemedicine and the availability of in person appointments. The patient expressed understanding and agreed to proceed.  I acted as a Neurosurgeon for [employer], [employer], LPN   Subjective:   HPI:   Anxiety Pt is currently taking Prozac 10 mg daily working well for her. 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 Denies concerning symptoms with the Prozac 10 mg daily. Does not feel like she needs dose increase. Has been able to decrease melatonin from 15 to 10 mg daily. Denies SI/HI.  GAD 7 : Generalized Anxiety Score 09/30/2020 03/19/2020  Nervous, Anxious, on Edge 0 0  Control/stop worrying 1 0  Worry too much - different things 1 0  Trouble relaxing 0 0  Restless 0 0  Easily annoyed or irritable 0 0  Afraid - awful might happen 1 0  Total GAD 7 Score 3 0  Anxiety Difficulty Not difficult at all Not difficult at all      ROS: See pertinent positives and negatives per HPI.  Patient Active Problem List   Diagnosis Date Noted  . IBS (irritable bowel syndrome) 08/29/2019  . Hydradenitis 08/29/2019    Social History   Tobacco Use  . Smoking status: Never Smoker  . Smokeless tobacco: Never Used  Substance Use Topics  . Alcohol use: Yes    Comment: socially    Current Outpatient Medications:  .  Cyanocobalamin (B-12) 1000 MCG TABS, Take 1,000 mcg by mouth daily., Disp: , Rfl:  .  FLUoxetine (PROZAC) 10 MG capsule, Take 1 capsule (10 mg total) by mouth daily., Disp: 90 capsule, Rfl: 3 .   Melatonin 5 MG CAPS, Take 10 mg by mouth at bedtime., Disp: , Rfl:  .  polyethylene glycol powder (GLYCOLAX/MIRALAX) powder, Take 1 Container by mouth as needed. , Disp: , Rfl:   Allergies  Allergen Reactions  . Cephalexin Itching  . Hydrocodone Anaphylaxis and Shortness Of Breath  . Sulfa Antibiotics Hives  . Naproxen Other (See Comments)    Nose bleeds  . Aspirin Other (See Comments)    Nose bleeds  . Pollen Extract     Objective:   VITALS: Per patient if applicable, see vitals. GENERAL: Alert, appears well and in no acute distress. HEENT: Atraumatic, conjunctiva clear, no obvious abnormalities on inspection of external nose and ears. NECK: Normal movements of the head and neck. CARDIOPULMONARY: No increased WOB. Speaking in clear sentences. I:E ratio WNL.  MS: Moves all visible extremities without noticeable abnormality. PSYCH: Pleasant and cooperative, well-groomed. Speech normal rate and rhythm. Affect is appropriate. Insight and judgement are appropriate. Attention is focused, linear, and appropriate.  NEURO: CN grossly intact. Oriented as arrived to appointment on time with no prompting. Moves both UE equally.  SKIN: No obvious lesions, wounds, erythema, or cyanosis noted on face or hands.  Assessment and Plan:   Gina Moss was seen today for anxiety.  Diagnoses and all orders for this visit:  Anxiety -     FLUoxetine (PROZAC) 10 MG capsule; Take 1 capsule (10 mg total) by  mouth daily.   Well controlled. Continue Prozac 10 mg daily. Follow-up in 1 year, sooner if concerns.  I discussed the assessment and treatment plan with the patient. The patient was provided an opportunity to ask questions and all were answered. The patient agreed with the plan and demonstrated an understanding of the instructions.   The patient was advised to call back or seek an in-person evaluation if the symptoms worsen or if the condition fails to improve as anticipated.   CMA or LPN served  as scribe during this visit. History, Physical, and Plan performed by medical provider. The above documentation has been reviewed and is accurate and complete.   [place], [place] 09/30/2020

## 2020-10-22 MED FILL — FLUoxetine HCL 10 MG CAPS: 10 | 30 days supply | Qty: 30 | Fill #0

## 2020-11-15 MED FILL — FLUoxetine HCL 10 MG CAPS: 10 | 30 days supply | Qty: 30 | Fill #1

## 2020-12-31 MED FILL — FLUoxetine HCL 10 MG CAPS: 10 | 30 days supply | Qty: 30 | Fill #2

## 2021-01-27 ENCOUNTER — Telehealth (INDEPENDENT_AMBULATORY_CARE_PROVIDER_SITE_OTHER): Payer: 59 | Admitting: Family Medicine

## 2021-01-27 ENCOUNTER — Encounter: Payer: Self-pay | Admitting: Family Medicine

## 2021-01-27 DIAGNOSIS — J029 Acute pharyngitis, unspecified: Secondary | ICD-10-CM | POA: Diagnosis not present

## 2021-01-27 NOTE — Progress Notes (Signed)
Virtual Visit via Video Note  Subjective  CC:  Chief Complaint  Patient presents with  . Sore Throat    Sore throat started this morning, at home test was negative. Employer is requiring dr visit for guidance on when she can return to work     I connected with Gina Moss on 01/27/21 at  4:00 PM EST by a video enabled telemedicine application and verified that I am speaking with the correct person using two identifiers. Location patient: Home Location provider: [facility] [address], Office Persons participating in the virtual visit: Gina Moss, Gina Ora, MD Adela Glimpse CMA  I discussed the limitations of evaluation and management by telemedicine and the availability of in person appointments. The patient expressed understanding and agreed to proceed. HPI: Gina Moss is a 30 y.o. female who was contacted today to address the problems listed above in the chief complaint. . 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  Has not needed any medications for pain.  Eating and drinking okay.  No fevers, chills, cough, nasal congestion, sinus pressure, postnasal drainage, sneezing or GI symptoms.  She is fully vaccinated.  She reports she had her booster vaccine in October of last year.  She was exposed to a Covid positive patient late last week but was in full PPI including facial.  She has no myalgias.  Home test today was negative.   Assessment  1. Sore throat      Plan   Sore throat: Discussed need for this possible illness to declare itself further.  Given mild sore throat, multiple etiologies exist.  Discussed possible Covid due to fully vaccinated patient, other viral illness, allergies or postnasal drainage etc.  I recommend her staying home for the next 24 to 48 hours to see if symptoms worsen any.  If she develops more URI or Covid related symptoms, I recommend  a PCR test on day 3 of 5.  If her symptoms persist and are mild, she can stay out of work for the next 5 days.  Fortunately she can do telehealth visits.  Questions answered.  Follow-up if needed. I discussed the assessment and treatment plan with the patient. The patient was provided an opportunity to ask questions and all were answered. The patient agreed with the plan and demonstrated an understanding of the instructions.   The patient was advised to call back or seek an in-person evaluation if the symptoms worsen or if the condition fails to improve as anticipated. Follow up: If needed Visit date not found  No orders of the defined types were placed in this encounter.     I reviewed the patients updated PMH, FH, and SocHx.    Patient Active Problem List   Diagnosis Date Noted  . IBS (irritable bowel syndrome) 08/29/2019  . Hydradenitis 08/29/2019   Current Meds  Medication Sig  . Cyanocobalamin (B-12) 1000 MCG TABS Take 1,000 mcg by mouth daily.  Marland Kitchen FLUoxetine (PROZAC) 10 MG capsule Take 1 capsule (10 mg total) by mouth daily.  . Melatonin 5 MG CAPS Take 10 mg by mouth at bedtime.  . polyethylene glycol powder (GLYCOLAX/MIRALAX) powder Take 1 Container by mouth as needed.     Allergies: Patient is allergic to cephalexin, hydrocodone, sulfa antibiotics, naproxen, aspirin, and pollen extract. Family History: Patient family history includes Alcohol abuse in her paternal grandfather; Depression in her paternal grandmother;  Hodgkin's lymphoma in her maternal uncle; Hypercholesterolemia in her father; Leukemia in her maternal grandmother. Social History:  Patient  reports that she has never smoked. She has never used smokeless tobacco. She reports current alcohol use. She reports that she does not use drugs.  Review of Systems: Constitutional: Negative for fever malaise or anorexia Cardiovascular: negative for chest pain Respiratory: negative for SOB or persistent  cough Gastrointestinal: negative for abdominal pain  OBJECTIVE Vitals: There were no vitals taken for this visit.  Reports afebrile General: no acute distress , A&Ox3, appears well  Gina Ora, MD

## 2021-04-10 ENCOUNTER — Telehealth: Payer: Self-pay

## 2021-04-10 NOTE — Telephone Encounter (Signed)
Patient notified she will pick up

## 2021-04-10 NOTE — Telephone Encounter (Signed)
Patient is requesting a copy of her immunization record for a job can we please print off and call when ready to be picked up

## 2021-12-08 ENCOUNTER — Emergency Department
Admission: EM | Admit: 2021-12-08 | Discharge: 2021-12-08 | Disposition: A | Discharge status: Home / Self Care | Payer: Managed Care, Other (non HMO) | Attending: Emergency Medicine | Admitting: Emergency Medicine

## 2021-12-08 ENCOUNTER — Other Ambulatory Visit: Payer: Self-pay

## 2021-12-08 DIAGNOSIS — R1032 Left lower quadrant pain: Secondary | ICD-10-CM | POA: Insufficient documentation

## 2021-12-08 DIAGNOSIS — D72829 Elevated white blood cell count, unspecified: Secondary | ICD-10-CM | POA: Diagnosis not present

## 2021-12-08 DIAGNOSIS — R110 Nausea: Secondary | ICD-10-CM

## 2021-12-08 DIAGNOSIS — R109 Unspecified abdominal pain: Secondary | ICD-10-CM

## 2021-12-08 DIAGNOSIS — R197 Diarrhea, unspecified: Secondary | ICD-10-CM | POA: Insufficient documentation

## 2021-12-08 LAB — CBC
HCT: 45.7 % (ref 36.0–46.0)
Hemoglobin: 15.4 g/dL — ABNORMAL HIGH (ref 12.0–15.0)
MCH: 31.3 pg (ref 26.0–34.0)
MCHC: 33.7 g/dL (ref 30.0–36.0)
MCV: 92.9 fL (ref 80.0–100.0)
Platelets: 412 10*3/uL — ABNORMAL HIGH (ref 150–400)
RBC: 4.92 MIL/uL (ref 3.87–5.11)
RDW: 12.7 % (ref 11.5–15.5)
WBC: 13.9 10*3/uL — ABNORMAL HIGH (ref 4.0–10.5)
nRBC: 0 % (ref 0.0–0.2)

## 2021-12-08 LAB — COMPREHENSIVE METABOLIC PANEL
ALT: 16 U/L (ref 0–44)
AST: 15 U/L (ref 15–41)
Albumin: 4.5 g/dL (ref 3.5–5.0)
Alkaline Phosphatase: 84 U/L (ref 38–126)
Anion gap: 9 (ref 5–15)
BUN: 10 mg/dL (ref 6–20)
CO2: 25 mmol/L (ref 22–32)
Calcium: 9.1 mg/dL (ref 8.9–10.3)
Chloride: 102 mmol/L (ref 98–111)
Creatinine, Ser: 0.83 mg/dL (ref 0.44–1.00)
GFR, Estimated: >60 mL/min (ref >60)
Glucose, Bld: 118 mg/dL — ABNORMAL HIGH (ref 70–99)
Potassium: 3.9 mmol/L (ref 3.5–5.1)
Sodium: 136 mmol/L (ref 135–145)
Total Bilirubin: 1.1 mg/dL (ref 0.3–1.2)
Total Protein: 8 g/dL (ref 6.5–8.1)

## 2021-12-08 LAB — URINALYSIS, ROUTINE W REFLEX MICROSCOPIC
Bilirubin Urine: NEGATIVE
Glucose, UA: NEGATIVE mg/dL
Hgb urine dipstick: NEGATIVE
Ketones, ur: NEGATIVE mg/dL
Leukocytes,Ua: NEGATIVE
Nitrite: NEGATIVE
Protein, ur: NEGATIVE mg/dL
Specific Gravity, Urine: 1.021 (ref 1.005–1.030)
pH: 6 (ref 5.0–8.0)

## 2021-12-08 LAB — POC URINE PREG, ED: Preg Test, Ur: NEGATIVE

## 2021-12-08 LAB — LIPASE, BLOOD: Lipase: 29 U/L (ref 11–51)

## 2021-12-08 MED ORDER — DICYCLOMINE HCL 10 MG PO CAPS
20.0000 mg | ORAL_CAPSULE | Freq: Once | ORAL | Status: AC
  Administered 2021-12-08: 20 mg via ORAL
  Filled 2021-12-08: qty 2

## 2021-12-08 MED ORDER — ONDANSETRON 4 MG PO TBDP
4.0000 mg | ORAL_TABLET | Freq: Once | ORAL | Status: AC | PRN
  Administered 2021-12-08: 4 mg via ORAL
  Filled 2021-12-08: qty 1

## 2021-12-08 MED ORDER — HALOPERIDOL LACTATE 5 MG/ML IJ SOLN
2.5000 mg | Freq: Once | INTRAMUSCULAR | Status: AC
  Administered 2021-12-08: 2.5 mg via INTRAVENOUS
  Filled 2021-12-08: qty 1

## 2021-12-08 MED ORDER — DICYCLOMINE HCL 10 MG PO CAPS: 10.0000 mg | ORAL_CAPSULE | Freq: Three times a day (TID) | ORAL | 0 refills | Status: DC | PRN

## 2021-12-08 MED ORDER — SODIUM CHLORIDE 0.9 % IV BOLUS
1000.0000 mL | Freq: Once | INTRAVENOUS | Status: AC
  Administered 2021-12-08: 1000 mL via INTRAVENOUS

## 2021-12-08 MED ORDER — ONDANSETRON HCL 4 MG PO TABS: 4.0000 mg | ORAL_TABLET | Freq: Three times a day (TID) | ORAL | 0 refills | Status: AC | PRN

## 2021-12-08 NOTE — ED Triage Notes (Signed)
Pt here with abd pain that started this morning. Pt also having N/V/D. Pt is in her center abd region. Pt in NAD in triage. Pt has hx of SBO surgery in past.

## 2021-12-08 NOTE — ED Provider Notes (Signed)
[facility] Provider Note    Event Date/Time   First MD Initiated Contact with Patient 12/08/21 1613     (approximate)   History   Abdominal Pain and Nausea   HPI  Gina Moss is a 31 y.o. female  who, per outpatient clinic note dated 12/27/17 has history of IBS, presents to the emergency department today because of concerns for abdominal discomfort, nausea and diarrhea.  Patient states symptoms started the last night.  Patient had been in her normal state of health the previous day.  Initially the discomfort started in the left upper quadrant before moving periumbilically.  She has not been able to tolerate p.o.  She has had large amount of diarrhea.  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 Has history of IBS but it is more constipation related. As opposed to nursing triage note she denies any history of surgery for SBO, says she had a polyp removed during a colonoscopy.       Physical Exam   Triage Vital Signs: ED Triage Vitals  Enc Vitals Group     BP 12/08/21 1354 107/68     Pulse Rate 12/08/21 1353 (!) 137     Resp 12/08/21 1353 18     Temp 12/08/21 1353 99.3 F (37.4 C)     Temp Source 12/08/21 1353 Oral     SpO2 12/08/21 1353 99 %     Weight 12/08/21 1354 240 lb (108.9 kg)     Height 12/08/21 1354 5\' 8"  (1.727 m)     Head Circumference --      Peak Flow --      Pain Score 12/08/21 1354 7   Most recent vital signs: Vitals:   12/08/21 1353 12/08/21 1354  BP:  107/68  Pulse: (!) 137   Resp: 18   Temp: 99.3 F (37.4 C)   SpO2: 99%     General: Awake, no distress.  CV:  Good peripheral perfusion. Tachycardic Resp:  Normal effort.  Abd:  No distention. Mildly tender to palpation periumbilically.    ED Results / Procedures / Treatments   Labs (all labs ordered are listed, but only abnormal results are displayed) Labs Reviewed  COMPREHENSIVE METABOLIC PANEL - Abnormal;  Notable for the following components:      Result Value   Glucose, Bld 118 (*)    All other components within normal limits  CBC - Abnormal; Notable for the following components:   WBC 13.9 (*)    Hemoglobin 15.4 (*)    Platelets 412 (*)    All other components within normal limits  LIPASE, BLOOD  URINALYSIS, ROUTINE W REFLEX MICROSCOPIC  POC URINE PREG, ED     EKG  I, 02/05/22, attending physician, personally viewed and interpreted this EKG  EKG Time: 1359 Rate: 145 Rhythm: sinus tachycardia Axis: normal Intervals: qtc 540 QRS: narrow ST changes: no st elevation Impression: abnormal ekg   RADIOLOGY None    PROCEDURES:  Critical Care performed: No  Procedures   MEDICATIONS ORDERED IN ED: Medications  ondansetron (ZOFRAN-ODT) disintegrating tablet 4 mg (has no administration in time range)     IMPRESSION / MDM / ASSESSMENT AND PLAN / ED COURSE  I reviewed the triage vital signs and the nursing notes.  Differential diagnosis includes, but is not limited to, appendicitis, pancreatitis, cholecystitis, gastroenteritis.  Patient presented to the emergency department today because of concerns for abdominal pain, nausea and diarrhea.  Symptoms started last night.  On exam patient with some diffuse tenderness.  Abdomen is soft.  Blood work shows a mild leukocytosis.  CMP without concerning hepatitis.  Lipase not elevated.  I did have a discussion with the patient about the blood work.  We did discuss the mildly elevated white count.  However at this time given lack of focal tenderness I have low concern for appendicitis or cholecystitis.  We did discuss possible gastro enteritis.  I did offer CT scan to further evaluate however patient declined at this time which I think is completely reasonable.  Additionally she was found to be quite tachycardic upon arrival.  Do think this is likely secondary to poor p.o. intake.  Her heart rate did  improve after IV fluids and we discussed this as well.  Patient's symptoms improved with medication here in the emergency department.  UA without pregnancy or concerning signs for infection.  Will plan on discharging with symptomatic treatment.  Did discuss return precautions.  FINAL CLINICAL IMPRESSION(S) / ED DIAGNOSES   Final diagnoses:  Abdominal pain, unspecified abdominal location  Diarrhea, unspecified type  Nausea     Rx / DC Orders   ED Discharge Orders     None        Note:  This document was prepared using Dragon voice recognition software and may include unintentional dictation errors.    Phineas Semen, MD 12/08/21 [phone]

## 2021-12-08 NOTE — ED Provider Triage Note (Signed)
Emergency Medicine Provider Triage Evaluation Note  Gina Moss , a 31 y.o. female  was evaluated in triage.  Pt complains of abdominal pain in left upper quadrant that has now radiated into the mid abdomen along with nausea, vomiting, and diarrhea. Symptoms started to day. Unable to tolerate food or fluids..  Review of Systems  Positive: Abdominal pain, nausea, vomiting, diarrhea, fever Negative: Known exposure to illness  Physical Exam  There were no vitals taken for this visit. Gen:   Awake, no distress   Resp:  Normal effort  MSK:   Moves extremities without difficulty  Other:    Medical Decision Making  Medically screening exam initiated at 1:52 PM.  Appropriate orders placed.  Gina Moss was informed that the remainder of the evaluation will be completed by another provider, this initial triage assessment does not replace that evaluation, and the importance of remaining in the ED until their evaluation is complete.   Gina Pester, FNP 12/08/21 1358

## 2021-12-08 NOTE — Discharge Instructions (Signed)
Please seek medical attention for any high fevers, chest pain, shortness of breath, change in behavior, persistent vomiting, bloody stool or any other new or concerning symptoms.  

## 2022-09-02 ENCOUNTER — Ambulatory Visit (INDEPENDENT_AMBULATORY_CARE_PROVIDER_SITE_OTHER): Payer: BC Managed Care – PPO | Admitting: Radiology

## 2022-09-02 ENCOUNTER — Encounter: Payer: Self-pay | Admitting: Radiology

## 2022-09-02 ENCOUNTER — Other Ambulatory Visit (HOSPITAL_COMMUNITY)
Admission: RE | Admit: 2022-09-02 | Discharge: 2022-09-02 | Disposition: A | Discharge status: Home / Self Care | Payer: BC Managed Care – PPO | Source: Ambulatory Visit | Attending: Radiology | Admitting: Radiology

## 2022-09-02 VITALS — BP 116/84 | Ht 67.0 in | Wt 258.0 lb

## 2022-09-02 DIAGNOSIS — N921 Excessive and frequent menstruation with irregular cycle: Secondary | ICD-10-CM

## 2022-09-02 DIAGNOSIS — N926 Irregular menstruation, unspecified: Secondary | ICD-10-CM | POA: Diagnosis not present

## 2022-09-02 DIAGNOSIS — Z01419 Encounter for gynecological examination (general) (routine) without abnormal findings: Secondary | ICD-10-CM | POA: Insufficient documentation

## 2022-09-02 LAB — PREGNANCY, URINE: Preg Test, Ur: NEGATIVE

## 2022-09-02 NOTE — Progress Notes (Signed)
   Zarriah Starkel November 05, 1991 [id number]   History:  31 y.o. G0 presents for annual exam.AEX Complains of irregular periods, heavy, passes clots x's 1 year. 6-7 weeks between cycles. Began after having COVID. Some pain with intercourse.  Gynecologic History Patient's last menstrual period was 08/23/2022 (exact date). Period Cycle (Days):  (irregular periods x's 1 year) Period Duration (Days): 5 Period Pattern: (!) Irregular Menstrual Flow: Heavy (passes clots) Menstrual Control: Maxi pad (menstrual panties) Dysmenorrhea: (!) Moderate Dysmenorrhea Symptoms: Cramping Contraception/Family planning: vasectomy Sexually active: yes Last Pap: 2020. Results were: normal   Obstetric History OB History  Gravida Para Term Preterm AB Living  0 0 0 0 0 0  SAB IAB Ectopic Multiple Live Births  0 0 0 0 0     The following portions of the patient's history were reviewed and updated as appropriate: allergies, current medications, past family history, past medical history, past social history, past surgical history, and problem list.  Review of Systems Pertinent items noted in HPI and remainder of comprehensive ROS otherwise negative.   Past medical history, past surgical history, family history and social history were all reviewed and documented in the EPIC chart.   Exam:  Vitals:   09/02/22 1519  BP: 116/84  Weight: 258 lb (117 kg)  Height: 5\' 7"  (1.702 m)   Body mass index is 40.41 kg/m.  General appearance:  Normal, obese Thyroid:  Symmetrical, normal in size, without palpable masses or nodularity. Respiratory  Auscultation:  Clear without wheezing or rhonchi Cardiovascular  Auscultation:  Regular rate, without rubs, murmurs or gallops  Edema/varicosities:  Not grossly evident Abdominal  Soft,nontender, without masses, guarding or rebound.  Liver/spleen:  No organomegaly noted  Hernia:  None appreciated  Skin  Inspection:  Grossly normal Breasts: Examined lying and  sitting.   Right: Without masses, retractions, nipple discharge or axillary adenopathy.   Left: Without masses, retractions, nipple discharge or axillary adenopathy. Genitourinary   Inguinal/mons:  Normal without inguinal adenopathy  External genitalia:  Normal appearing vulva with no masses, tenderness, or lesions  BUS/Urethra/Skene's glands:  Normal without masses or exudate  Vagina:  Normal appearing with normal color and discharge, no lesions  Cervix:  Normal appearing without discharge or lesions  Uterus:  Normal in size, shape and contour.  Mobile, nontender  Adnexa/parametria:     Rt: Normal in size, without masses or tenderness.   Lt: Normal in size, without masses or tenderness.  Anus and perineum: Normal   Patient informed chaperone available to be present for breast and pelvic exam. Patient has requested no chaperone to be present. Patient has been advised what will be completed during breast and pelvic exam.   Assessment/Plan:   1. Well woman exam with routine gynecological exam - Cytology - PAP( [place]) - Establish with PCP  2. Irregular periods - Pregnancy, urine - Thyroid Panel With TSH - Estradiol - DHEA-sulfate - Testos,Total,Free and SHBG (Female)  3. Morbid obesity (HCC) - HgB A1c  4. Menorrhagia with irregular cycle - CBC - US Transvaginal Non-OB; Future     Discussed SBE, colonoscopy and DEXA screening as directed/appropriate. Recommend 167mins of exercise weekly, including weight bearing exercise. Encouraged the use of seatbelts and sunscreen. Return in 1 year for annual or as needed.   Rubbie Battiest B WHNP-BC 3:45 PM 09/02/2022

## 2022-09-04 ENCOUNTER — Telehealth: Payer: Self-pay

## 2022-09-04 DIAGNOSIS — D75839 Thrombocytosis, unspecified: Secondary | ICD-10-CM

## 2022-09-04 LAB — CYTOLOGY - PAP
Comment: NEGATIVE
Diagnosis: NEGATIVE
High risk HPV: NEGATIVE

## 2022-09-04 NOTE — Telephone Encounter (Signed)
Kerry Dory, NP  [facility] Triage Thrombocythemia on labs, trending up for the past year [phone]) , please refer to hematology.   Referral sent.

## 2022-09-07 ENCOUNTER — Inpatient Hospital Stay: Payer: BC Managed Care – PPO

## 2022-09-07 ENCOUNTER — Inpatient Hospital Stay: Payer: BC Managed Care – PPO | Attending: Internal Medicine | Admitting: Internal Medicine

## 2022-09-07 ENCOUNTER — Encounter: Payer: Self-pay | Admitting: Internal Medicine

## 2022-09-07 VITALS — BP 139/78 | HR 103 | Temp 98.3°F | Resp 18 | Ht 67.0 in | Wt 256.7 lb

## 2022-09-07 DIAGNOSIS — D75839 Thrombocytosis, unspecified: Secondary | ICD-10-CM | POA: Diagnosis present

## 2022-09-07 DIAGNOSIS — Z806 Family history of leukemia: Secondary | ICD-10-CM | POA: Insufficient documentation

## 2022-09-07 LAB — IRON AND TIBC
Iron: 86 ug/dL (ref 28–170)
Saturation Ratios: 22 % (ref 10.4–31.8)
TIBC: 391 ug/dL (ref 250–450)
UIBC: 305 ug/dL

## 2022-09-07 LAB — C-REACTIVE PROTEIN: CRP: 1 mg/dL — ABNORMAL HIGH (ref <1.0)

## 2022-09-07 LAB — FERRITIN: Ferritin: 65 ng/mL (ref 11–307)

## 2022-09-07 LAB — SEDIMENTATION RATE: Sed Rate: 30 mm/hr — ABNORMAL HIGH (ref 0–20)

## 2022-09-07 NOTE — Progress Notes (Signed)
New patient evaluation.   

## 2022-09-07 NOTE — Progress Notes (Signed)
[facility]  [phone]) [phone] [phone]) [phone]  ID: Gina Moss OB: January 17, 1991  MR#: [id number]  JAS#:[id number]  Patient Care Team: Pcp, No as PCP - General Sedalia Muta, PT as Physical Therapist (Physical Therapy)  REFERRING PROVIDER: Arlie Solomons, NP  REASON FOR REFERRAL: thrombocytosis   HPI: Gina Moss is a 31 y.o. 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  Patient reports heavy menstrual period for past 1 year.  It occurs every 3 to 6 weeks.  Last period had heavy bleeding where she had to change pad every hour which lasted 4 to 5 days.  She has night sweats 2-3 times per week for past 1 year.  At times, it is drenching and has to change her clothes.  She feels fatigued.  Denies any blood clots. Patient denies fever, chills, nausea, vomiting, shortness of breath, cough, abdominal pain.  She takes vitamin B12, magnesium and melatonin.  Otherwise denies any OTC supplements.  Labs reviewed.  CBC from 09/02/2022 showed WBC 9.4, hemoglobin 13.7 and platelet 428.  CBC from 12/08/2021 showed WBC 13.9, hemoglobin 15.4 and platelets 412.  CBC from 2021 and 2020 normal.  REVIEW OF SYSTEMS:   ROS  As per HPI. Otherwise, a complete review of systems is negative.  PAST MEDICAL HISTORY: Past Medical History:  Diagnosis Date   History of chicken pox    Hydradenitis     cysts drained in 2018 and 2019   Hydradenitis    IBS (irritable bowel syndrome) 2018    PAST SURGICAL HISTORY: Past Surgical History:  Procedure Laterality Date   TONSILLECTOMY AND ADENOIDECTOMY  2007   WISDOM TOOTH EXTRACTION Bilateral 2007    FAMILY HISTORY: Family History  Problem Relation Age of Onset   Hypercholesterolemia Father    Hodgkin's lymphoma Maternal Uncle    Leukemia Maternal Grandmother    Depression Paternal Grandmother    Alcohol abuse Paternal Grandfather     Prostate cancer Paternal Grandfather    Crohn's disease Other        paternal first cousin x's 3    HEALTH MAINTENANCE: Social History   Tobacco Use   Smoking status: Never   Smokeless tobacco: Never  Vaping Use   Vaping Use: Never used  Substance Use Topics   Alcohol use: Yes    Comment: socially   Drug use: Never     Allergies  Allergen Reactions   Cephalexin Itching   Hydrocodone Anaphylaxis and Shortness Of Breath   Sulfa Antibiotics Hives   Naproxen Other (See Comments)    Nose bleeds   Aspirin Other (See Comments)    Nose bleeds   Bee Pollen Hives   Pollen Extract     Current Outpatient Medications  Medication Sig Dispense Refill   Cyanocobalamin (B-12 PO) Take by mouth. B12 with folic acid     magnesium oxide (MAG-OX) 400 (240 Mg) MG tablet Take 400 mg by mouth daily.     Melatonin 5 MG CAPS Take 10 mg by mouth at bedtime.     ondansetron (ZOFRAN) 4 MG tablet Take 1 tablet (4 mg total) by mouth every 8 (eight) hours as needed. 20 tablet 0   polyethylene glycol powder (GLYCOLAX/MIRALAX) powder Take 1 Container by mouth as needed.      No current facility-administered medications for this visit.    OBJECTIVE: Vitals:   09/07/22 1400  BP: 139/78  Pulse: (!) 103  Resp: 18  Temp:  98.3 F (36.8 C)     Body mass index is 40.2 kg/m.      General: Well-developed, well-nourished, no acute distress. Eyes: Pink conjunctiva, anicteric sclera. HEENT: Normocephalic, moist mucous membranes, clear oropharnyx. Lungs: Clear to auscultation bilaterally. Heart: Regular rate and rhythm. No rubs, murmurs, or gallops. Abdomen: Soft, nontender, nondistended. No organomegaly noted, normoactive bowel sounds. Musculoskeletal: No edema, cyanosis, or clubbing. Neuro: Alert, answering all questions appropriately. Cranial nerves grossly intact. Skin: No rashes or petechiae noted. Psych: Normal affect. Lymphatics: No cervical, calvicular, axillary or inguinal LAD.   LAB  RESULTS:  Lab Results  Component Value Date   NA 136 12/08/2021   K 3.9 12/08/2021   CL 102 12/08/2021   CO2 25 12/08/2021   GLUCOSE 118 (H) 12/08/2021   BUN 10 12/08/2021   CREATININE 0.83 12/08/2021   CALCIUM 9.1 12/08/2021   PROT 8.0 12/08/2021   ALBUMIN 4.5 12/08/2021   AST 15 12/08/2021   ALT 16 12/08/2021   ALKPHOS 84 12/08/2021   BILITOT 1.1 12/08/2021   GFRNONAA >60 12/08/2021    Lab Results  Component Value Date   WBC 9.4 09/02/2022   NEUTROABS 5.3 01/04/2020   HGB 13.7 09/02/2022   HCT 40.5 09/02/2022   MCV 91.0 09/02/2022   PLT 428 (H) 09/02/2022    No results found for: "TIBC", "FERRITIN", "IRONPCTSAT"   STUDIES: No results found.  ASSESSMENT AND PLAN:   Mirta Moss is a 31 y.o. 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  #Thrombocytosis -Unclear etiology - CBC from 09/12/2022 showed WBC 9.4, hemoglobin 13.7 and platelet 428.  CBC from 12/08/2021 showed WBC 13.9, hemoglobin 15.4 and platelets 412.  CBC from 2021 and 2020 normal. -Work as below to rule out inflammation, iron deficiency. I will also do mutational testing to rule out MPN since she has been having drenching night sweats -Denies any history of smoking.  Denies any spleen surgery.  Medication list reviewed and no offending agents.  Orders Placed This Encounter  Procedures   Ferritin   Sedimentation rate   C-reactive protein   JAK2 V617F rfx CALR/MPL/E12-15   Iron and TIBC([facility])   RTC in 2 weeks for MyChart video  To discuss labs.  Patient expressed understanding and was in agreement with this plan. She also understands that She can call clinic at any time with any questions, concerns, or complaints.   I spent a total of 45 minutes reviewing chart data, face-to-face evaluation with the patient, counseling and coordination of care as detailed above.  Jane Canary, MD   09/07/2022 3:01  PM

## 2022-09-08 LAB — CBC
HCT: 40.5 % (ref 35.0–45.0)
Hemoglobin: 13.7 g/dL (ref 11.7–15.5)
MCH: 30.8 pg (ref 27.0–33.0)
MCHC: 33.8 g/dL (ref 32.0–36.0)
MCV: 91 fL (ref 80.0–100.0)
MPV: 8.8 fL (ref 7.5–12.5)
Platelets: 428 10*3/uL — ABNORMAL HIGH (ref 140–400)
RBC: 4.45 10*6/uL (ref 3.80–5.10)
RDW: 12.7 % (ref 11.0–15.0)
WBC: 9.4 10*3/uL (ref 3.8–10.8)

## 2022-09-08 LAB — TESTOS,TOTAL,FREE AND SHBG (FEMALE)
Free Testosterone: 4.3 pg/mL (ref 0.1–6.4)
Sex Hormone Binding: 25 nmol/L (ref 17–124)
Testosterone, Total, LC-MS-MS: 30 ng/dL (ref 2–45)

## 2022-09-08 LAB — HEMOGLOBIN A1C
Hgb A1c MFr Bld: 5.7 % of total Hgb — ABNORMAL HIGH (ref <5.7)
Mean Plasma Glucose: 117 mg/dL
eAG (mmol/L): 6.5 mmol/L

## 2022-09-08 LAB — THYROID PANEL WITH TSH
Free Thyroxine Index: 1.9 (ref 1.4–3.8)
T3 Uptake: 28 % (ref 22–35)
T4, Total: 6.9 ug/dL (ref 5.1–11.9)
TSH: 2.5 mIU/L

## 2022-09-08 LAB — ESTRADIOL: Estradiol: 33 pg/mL

## 2022-09-08 LAB — DHEA-SULFATE: DHEA-SO4: 232 ug/dL (ref 19–237)

## 2022-09-15 LAB — JAK2 V617F RFX CALR/MPL/E12-15

## 2022-09-15 LAB — CALR +MPL + E12-E15  (REFLEX)

## 2022-09-21 ENCOUNTER — Encounter: Payer: Self-pay | Admitting: Internal Medicine

## 2022-09-21 ENCOUNTER — Inpatient Hospital Stay (HOSPITAL_BASED_OUTPATIENT_CLINIC_OR_DEPARTMENT_OTHER): Payer: BC Managed Care – PPO | Admitting: Internal Medicine

## 2022-09-21 DIAGNOSIS — D75839 Thrombocytosis, unspecified: Secondary | ICD-10-CM

## 2022-09-21 DIAGNOSIS — D75838 Other thrombocytosis: Secondary | ICD-10-CM | POA: Diagnosis not present

## 2022-09-22 NOTE — Progress Notes (Signed)
[place]  Telephone:([phone] [phone]  I connected with Gina Moss on 09/23/22 at  3:15 PM EDT by my chart video and verified that I am speaking with the correct person using two identifiers.   I discussed the limitations, risks, security and privacy concerns of performing an evaluation and management service by telemedicine and the availability of in-person appointments. I also discussed with the patient that there may be a patient responsible charge related to this service. The patient expressed understanding and agreed to proceed.   Other persons participating in the visit and their role in the encounter: none   Patient's location: home  Provider's location: clinic   Chief Complaint: thrombocytosis discuss labs    ID: Gina Moss OB: 04-02-1991  MR#: [id number]  ZJI#:[id number]  Patient Care Team: Pcp, No as PCP - General Lyndee Hensen, PT as Physical Therapist (Physical Therapy)  REFERRING PROVIDER: Rubbie Battiest, NP  REASON FOR REFERRAL: thrombocytosis   HPI: Gina Moss is a 31 y.o. 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  Patient reports heavy menstrual period for past 1 year.  It occurs every 3 to 6 weeks.  Last period had heavy bleeding where she had to change pad every hour which lasted 4 to 5 days.  She has night sweats 2-3 times per week for past 1 year.  At times, it is drenching and has to change her clothes.  She feels fatigued.  Denies any blood clots. Patient denies fever, chills, nausea, vomiting, shortness of breath, cough, abdominal pain.  She takes vitamin B12, magnesium and melatonin.  Otherwise denies any OTC supplements.  Labs reviewed.  CBC from 09/02/2022 showed WBC 9.4, hemoglobin 13.7 and platelet 428.  CBC from 12/08/2021 showed WBC 13.9, hemoglobin 15.4 and platelets 412.  CBC from 2021 and 2020  normal.  INTERVAL HISTORY-  I did video visit with the patient today to discuss labs. She has been feeling well overall.  She has chronic pain in her neck and upper back and some tingling in left thumb.  She had neck and back injury several years ago.  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  Has chronic left knee pain.  She has IBS predominantly constipation which has been controlled.  She has history of hidradenitis and had cyst drained few years ago but nothing recently.  Denies any oral ulcers, rash, changes in color of skin on exposure to cold.  REVIEW OF SYSTEMS:   Review of Systems  Respiratory:  Negative for cough and shortness of breath.   Cardiovascular:  Negative for chest pain.  Gastrointestinal:  Negative for abdominal pain, nausea and vomiting.  Musculoskeletal:  Positive for back pain and neck pain.    As per HPI. Otherwise, a complete review of systems is negative.  PAST MEDICAL HISTORY: Past Medical History:  Diagnosis Date   History of chicken pox    Hydradenitis     cysts drained in 2018 and 2019   Hydradenitis    IBS (irritable bowel syndrome) 2018    PAST SURGICAL HISTORY: Past Surgical History:  Procedure Laterality Date   TONSILLECTOMY AND ADENOIDECTOMY  2007   WISDOM TOOTH EXTRACTION Bilateral 2007    FAMILY HISTORY: Family History  Problem Relation Age of Onset   Hypercholesterolemia Father    Hodgkin's lymphoma Maternal Uncle    Leukemia Maternal Grandmother    Depression Paternal Grandmother    Alcohol abuse  Paternal Grandfather    Prostate cancer Paternal Grandfather    Crohn's disease Other        paternal first cousin x's 3    HEALTH MAINTENANCE: Social History   Tobacco Use   Smoking status: Never   Smokeless tobacco: Never  Vaping Use   Vaping Use: Never used  Substance Use Topics   Alcohol use: Yes    Comment: socially   Drug use: Never     Allergies  Allergen Reactions   Cephalexin Itching   Hydrocodone  Anaphylaxis and Shortness Of Breath   Sulfa Antibiotics Hives   Naproxen Other (See Comments)    Nose bleeds   Aspirin Other (See Comments)    Nose bleeds   Bee Pollen Hives   Pollen Extract     Current Outpatient Medications  Medication Sig Dispense Refill   Cyanocobalamin (B-12 PO) Take by mouth. T70 with folic acid     magnesium oxide (MAG-OX) 400 (240 Mg) MG tablet Take 400 mg by mouth daily.     Melatonin 5 MG CAPS Take 10 mg by mouth at bedtime.     ondansetron (ZOFRAN) 4 MG tablet Take 1 tablet (4 mg total) by mouth every 8 (eight) hours as needed. 20 tablet 0   polyethylene glycol powder (GLYCOLAX/MIRALAX) powder Take 1 Container by mouth as needed.      No current facility-administered medications for this visit.    OBJECTIVE: There were no vitals filed for this visit.    There is no height or weight on file to calculate BMI.      Physical exam not performed for MyChart video.   LAB RESULTS:  Lab Results  Component Value Date   NA 136 12/08/2021   K 3.9 12/08/2021   CL 102 12/08/2021   CO2 25 12/08/2021   GLUCOSE 118 (H) 12/08/2021   BUN 10 12/08/2021   CREATININE 0.83 12/08/2021   CALCIUM 9.1 12/08/2021   PROT 8.0 12/08/2021   ALBUMIN 4.5 12/08/2021   AST 15 12/08/2021   ALT 16 12/08/2021   ALKPHOS 84 12/08/2021   BILITOT 1.1 12/08/2021   GFRNONAA >60 12/08/2021    Lab Results  Component Value Date   WBC 9.4 09/02/2022   NEUTROABS 5.3 01/04/2020   HGB 13.7 09/02/2022   HCT 40.5 09/02/2022   MCV 91.0 09/02/2022   PLT 428 (H) 09/02/2022    Lab Results  Component Value Date   TIBC 391 09/07/2022   FERRITIN 65 09/07/2022   IRONPCTSAT 22 09/07/2022     STUDIES: No results found.  ASSESSMENT AND PLAN:   Gina Moss is a 31 y.o. 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  #Thrombocytosis -Likely reactive from mild inflammation. - CBC from 09/12/2022 showed  WBC 9.4, hemoglobin 13.7 and platelet 428.  CBC from 12/08/2021 showed WBC 13.9, hemoglobin 15.4 and platelets 412.  CBC from 2021 and 2020 normal.  -Iron panel normal.  JAK2/MPL/CALR negative.  Denies smoking or splenectomy.  Medication list reviewed and no offending agents identified.  - ESR mildly elevated to 30.  CRP mildly elevated to 1. She has IBS predominantly constipation which has been controlled.  She has history of hidradenitis and had cyst drained few years ago but nothing recently.  Denies any oral ulcers, rash, changes in color of skin on exposure to cold suggestive of autoimmune disease.  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    Her thrombocytosis is likely reactive in nature from mild inflammation.  However at this time there is no clear reason for elevated inflammatory markers.  I advised to follow healthy diet and exercise on regular basis.  There is no indication for any bone marrow biopsy or medication to control platelet level at this time.  I will follow-up with her in 1 year with repeat CBC.   Orders Placed This Encounter  Procedures   CBC with Differential   RTC in 1 year for MD visit and labs.  Patient expressed understanding and was in agreement with this plan. She also understands that She can call clinic at any time with any questions, concerns, or complaints.   I spent a total of 25 minutes reviewing chart data, face-to-face evaluation with the patient, counseling and coordination of care as detailed above.  Jane Canary, MD   09/22/2022 12:42 PM

## 2022-09-25 NOTE — Telephone Encounter (Signed)
Pt seen on 09/07/22.

## 2022-10-01 ENCOUNTER — Ambulatory Visit (INDEPENDENT_AMBULATORY_CARE_PROVIDER_SITE_OTHER): Payer: BC Managed Care – PPO

## 2022-10-01 ENCOUNTER — Encounter: Payer: Self-pay | Admitting: Radiology

## 2022-10-01 ENCOUNTER — Ambulatory Visit (INDEPENDENT_AMBULATORY_CARE_PROVIDER_SITE_OTHER): Payer: BC Managed Care – PPO | Admitting: Radiology

## 2022-10-01 VITALS — BP 122/78

## 2022-10-01 DIAGNOSIS — L700 Acne vulgaris: Secondary | ICD-10-CM | POA: Diagnosis not present

## 2022-10-01 DIAGNOSIS — N921 Excessive and frequent menstruation with irregular cycle: Secondary | ICD-10-CM

## 2022-10-01 MED ORDER — NORGESTIM-ETH ESTRAD TRIPHASIC 0.18/0.215/0.25 MG-25 MCG PO TABS: 1.0000 | ORAL_TABLET | Freq: Every day | ORAL | 0 refills | Status: AC

## 2022-10-01 NOTE — Progress Notes (Signed)
   Gina Moss [birth year] [id number]   History:  31 y.o. here for f/u ultrasound re: menorrhagia with irregular cycles.   Gynecologic History Patient's last menstrual period was 09/26/2022 (exact date).   Contraception/Family planning: vasectomy Sexually active: yes   Obstetric History OB History  Gravida Para Term Preterm AB Living  0 0 0 0 0 0  SAB IAB Ectopic Multiple Live Births  0 0 0 0 0     The following portions of the patient's history were reviewed and updated as appropriate: allergies, current medications, past family history, past medical history, past social history, past surgical history, and problem list.  Review of Systems Pertinent items noted in HPI and remainder of comprehensive ROS otherwise negative.   Past medical history, past surgical history, family history and social history were all reviewed and documented in the EPIC chart.   Exam:  Vitals:   10/01/22 0932  BP: 122/78   There is no height or weight on file to calculate BMI.  Indications: menorrhagia with irregular cycle   Vaginal u/s Anteverted uterus, normal size and shape No myometrial masses Endometrium 4.70mm   Both ovaries normal size with normal follicle patter   No adnexal masses, no free fluid   Impression: normal gyn u/s  Assessment/Plan:    1. Menorrhagia with irregular cycle Reassured normal gyn u/s Will begin OCPs in hope to regulate cycle, reduce flow and help with acne - Norgestimate-Ethinyl Estradiol Triphasic (TRI-LO-SPRINTEC) 0.18/0.215/0.25 MG-25 MCG tab; Take 1 tablet by mouth daily.  Dispense: 84 tablet; Refill: 0  2. Acne vulgaris   Follow up 43months Gina Moss B WHNP-BC 10:06 AM 10/01/2022

## 2022-10-02 ENCOUNTER — Encounter: Payer: Self-pay | Admitting: Internal Medicine

## 2022-10-02 NOTE — Telephone Encounter (Signed)
This is not an urgent issue and should be addressed after Dr. Darrall Dears returns

## 2023-01-08 ENCOUNTER — Ambulatory Visit: Payer: BC Managed Care – PPO | Admitting: Radiology
